# Patient Record
Sex: Female | Born: 1985 | Race: Black or African American | Hispanic: No | Marital: Single | Smoking: Current every day smoker
Health system: Southern US, Community
[De-identification: ages and names within clinical notes are randomized; demographics above are authoritative.]

---

## 2012-03-26 HISTORY — PX: TUBAL LIGATION: SHX77

## 2016-01-11 ENCOUNTER — Emergency Department (HOSPITAL_COMMUNITY)
Admission: EM | Admit: 2016-01-11 | Discharge: 2016-01-11 | Disposition: A | Discharge status: Home / Self Care | Payer: Self-pay | Attending: Emergency Medicine | Admitting: Emergency Medicine

## 2016-01-11 ENCOUNTER — Encounter (HOSPITAL_COMMUNITY): Payer: Self-pay | Admitting: Cardiology

## 2016-01-11 DIAGNOSIS — Z3202 Encounter for pregnancy test, result negative: Secondary | ICD-10-CM | POA: Insufficient documentation

## 2016-01-11 DIAGNOSIS — R1030 Lower abdominal pain, unspecified: Secondary | ICD-10-CM | POA: Insufficient documentation

## 2016-01-11 DIAGNOSIS — F17200 Nicotine dependence, unspecified, uncomplicated: Secondary | ICD-10-CM | POA: Insufficient documentation

## 2016-01-11 DIAGNOSIS — N898 Other specified noninflammatory disorders of vagina: Secondary | ICD-10-CM | POA: Insufficient documentation

## 2016-01-11 LAB — URINE MICROSCOPIC-ADD ON

## 2016-01-11 LAB — COMPREHENSIVE METABOLIC PANEL
ALT: 20 U/L (ref 14–54)
AST: 22 U/L (ref 15–41)
Albumin: 4.7 g/dL (ref 3.5–5.0)
Alkaline Phosphatase: 83 U/L (ref 38–126)
Anion gap: 13 (ref 5–15)
BILIRUBIN TOTAL: 0.8 mg/dL (ref 0.3–1.2)
BUN: 11 mg/dL (ref 6–20)
CO2: 22 mmol/L (ref 22–32)
CREATININE: 0.7 mg/dL (ref 0.44–1.00)
Calcium: 9.9 mg/dL (ref 8.9–10.3)
Chloride: 105 mmol/L (ref 101–111)
Glucose, Bld: 101 mg/dL — ABNORMAL HIGH (ref 65–99)
POTASSIUM: 4 mmol/L (ref 3.5–5.1)
Sodium: 140 mmol/L (ref 135–145)
TOTAL PROTEIN: 8 g/dL (ref 6.5–8.1)

## 2016-01-11 LAB — URINALYSIS, ROUTINE W REFLEX MICROSCOPIC
Bilirubin Urine: NEGATIVE
Glucose, UA: NEGATIVE mg/dL
KETONES UR: NEGATIVE mg/dL
NITRITE: NEGATIVE
PROTEIN: NEGATIVE mg/dL
Specific Gravity, Urine: 1.021 (ref 1.005–1.030)
pH: 5.5 (ref 5.0–8.0)

## 2016-01-11 LAB — CBC
HEMATOCRIT: 41.7 % (ref 36.0–46.0)
Hemoglobin: 13.2 g/dL (ref 12.0–15.0)
MCH: 29.1 pg (ref 26.0–34.0)
MCHC: 31.7 g/dL (ref 30.0–36.0)
MCV: 91.9 fL (ref 78.0–100.0)
PLATELETS: 299 10*3/uL (ref 150–400)
RBC: 4.54 MIL/uL (ref 3.87–5.11)
RDW: 12.2 % (ref 11.5–15.5)
WBC: 9.2 10*3/uL (ref 4.0–10.5)

## 2016-01-11 LAB — WET PREP, GENITAL
SPERM: NONE SEEN
Trich, Wet Prep: NONE SEEN
Yeast Wet Prep HPF POC: NONE SEEN

## 2016-01-11 LAB — POC URINE PREG, ED: Preg Test, Ur: NEGATIVE

## 2016-01-11 MED ORDER — METRONIDAZOLE 500 MG PO TABS: 500.0000 mg | ORAL_TABLET | Freq: Two times a day (BID) | ORAL | Status: DC

## 2016-01-11 MED ORDER — DOXYCYCLINE HYCLATE 100 MG PO CAPS: 100.0000 mg | ORAL_CAPSULE | Freq: Two times a day (BID) | ORAL | Status: DC

## 2016-01-11 MED ORDER — PREDNISONE 20 MG PO TABS: 40.0000 mg | ORAL_TABLET | Freq: Every day | ORAL | Status: DC

## 2016-01-11 MED ORDER — AZITHROMYCIN 250 MG PO TABS
1000.0000 mg | ORAL_TABLET | Freq: Once | ORAL | Status: AC
  Administered 2016-01-11: 1000 mg via ORAL
  Filled 2016-01-11: qty 4

## 2016-01-11 MED ORDER — STERILE WATER FOR INJECTION IJ SOLN
INTRAMUSCULAR | Status: AC
  Administered 2016-01-11: 0.9 mL
  Filled 2016-01-11: qty 10

## 2016-01-11 MED ORDER — CEFTRIAXONE SODIUM 250 MG IJ SOLR
250.0000 mg | Freq: Once | INTRAMUSCULAR | Status: AC
  Administered 2016-01-11: 250 mg via INTRAMUSCULAR
  Filled 2016-01-11: qty 250

## 2016-01-11 NOTE — ED Provider Notes (Signed)
CSN: [id number]     Arrival date & time 01/11/16  1444 History   First MD Initiated Contact with Patient 01/11/16 1711     Chief Complaint  Patient presents with  . Vaginal Discharge  . Abdominal Pain   HPI Bianca Montgomery is a 30 y.o. F with no significant presenting with a 2 day history of vaginal discharge and lower abdominal pain. She describes her pain as 10/10 pain scale, suprapubic, non-radiating, crampy, intermittent. She recently found out her boyfriend was unfaithful. She denies fevers, chills, CP, SOB, N/V/D. She denies vaginal itching/odor.   History reviewed. No pertinent past medical history. History reviewed. No pertinent past surgical history. History reviewed. No pertinent family history. Social History  Substance Use Topics  . Smoking status: Current Every Day Smoker  . Smokeless tobacco: None  . Alcohol Use: Yes   OB History    No data available     Review of Systems  Ten systems are reviewed and are negative for acute change except as noted in the HPI  Allergies  Review of patient's allergies indicates no known allergies.  Home Medications   Prior to Admission medications   Medication Sig Start Date End Date Taking? Authorizing Provider  doxycycline (VIBRAMYCIN) 100 MG capsule Take 1 capsule (100 mg total) by mouth 2 (two) times daily. 01/11/16   Melton Krebs, PA-C  metroNIDAZOLE (FLAGYL) 500 MG tablet Take 1 tablet (500 mg total) by mouth 2 (two) times daily. 01/11/16   Melton Krebs, PA-C   BP 116/56 mmHg  Pulse 70  Temp(Src) 98.1 F (36.7 C) (Oral)  Resp 16  Ht  (1.727 m)  Wt 56.7 kg  BMI 19.01 kg/m2  SpO2 100%  LMP 12/31/2015 Physical Exam  Constitutional: She appears well-developed and well-nourished. No distress.  HENT:  Head: Normocephalic and atraumatic.  Mouth/Throat: Oropharynx is clear and moist. No oropharyngeal exudate.  Eyes: Conjunctivae are normal. Pupils are equal, round, and reactive to light. Right eye  exhibits no discharge. Left eye exhibits no discharge. No scleral icterus.  Neck: No tracheal deviation present.  Cardiovascular: Normal rate, regular rhythm, normal heart sounds and intact distal pulses.  Exam reveals no gallop and no friction rub.   No murmur heard. Pulmonary/Chest: Effort normal and breath sounds normal. No respiratory distress. She has no wheezes. She has no rales. She exhibits no tenderness.  Abdominal: Soft. Bowel sounds are normal. She exhibits no distension and no mass. There is tenderness. There is no rebound and no guarding.  Mild suprapubic tenderness  Genitourinary:  Pelvic exam: VULVA: normal appearing vulva with no masses, tenderness or lesions, VAGINA: normal appearing vagina with normal color and discharge, no lesions, CERVIX: off-white lesion present 5 o'clock, UTERUS: uterus is normal size, shape, consistency and nontender, ADNEXA: normal adnexa in size, nontender and no masses.   Musculoskeletal: She exhibits no edema.  Lymphadenopathy:    She has no cervical adenopathy.  Neurological: She is alert. Coordination normal.  Skin: Skin is warm and dry. No rash noted. She is not diaphoretic. No erythema.  Psychiatric: She has a normal mood and affect. Her behavior is normal.  Nursing note and vitals reviewed.   ED Course  Procedures  Labs Review Labs Reviewed  WET PREP, GENITAL - Abnormal; Notable for the following:    Clue Cells Wet Prep HPF POC PRESENT (*)    WBC, Wet Prep HPF POC TOO NUMEROUS TO COUNT (*)    All other components within normal limits  COMPREHENSIVE METABOLIC PANEL - Abnormal; Notable for the following:    Glucose, Bld 101 (*)    All other components within normal limits  URINALYSIS, ROUTINE W REFLEX MICROSCOPIC (NOT AT [facility]) - Abnormal; Notable for the following:    APPearance CLOUDY (*)    Hgb urine dipstick MODERATE (*)    Leukocytes, UA LARGE (*)    All other components within normal limits  URINE MICROSCOPIC-ADD ON - Abnormal;  Notable for the following:    Squamous Epithelial / LPF 0-5 (*)    Bacteria, UA FEW (*)    All other components within normal limits  CBC  RPR  HIV ANTIBODY (ROUTINE TESTING)  POC URINE PREG, ED  GC/CHLAMYDIA PROBE AMP ([facility]) NOT AT [facility]   MDM   Final diagnoses:  Vaginal discharge   Patient non-toxic appearing and VSS. Based on patient history and physical exam, most likely etiologies are UTI vs PID/STI. Less likely etiologies include appendicitis, Meckel's diverticulum, ruptured ectopic pregnancy, ovarian torsion, ovarian cyst, kidney stone, psoas abscess.  Clue cells on wet prep. UA with hematuria.   Medications  cefTRIAXone (ROCEPHIN) injection 250 mg (250 mg Intramuscular Given 01/11/16 1918)  azithromycin (ZITHROMAX) tablet 1,000 mg (1,000 mg Oral Given 01/11/16 1918)  sterile water (preservative free) injection (0.9 mLs  Given 01/11/16 1920)   Patient feels improved after observation and/or treatment in ED.  Patient may be safely discharged home with  Discharge Medication List as of 01/11/2016  7:12 PM    START taking these medications   Details  doxycycline (VIBRAMYCIN) 100 MG capsule Take 1 capsule (100 mg total) by mouth 2 (two) times daily., Starting 01/11/2016, Until Discontinued, Print    metroNIDAZOLE (FLAGYL) 500 MG tablet Take 1 tablet (500 mg total) by mouth 2 (two) times daily., Starting 01/11/2016, Until Discontinued, Print       Discussed reasons for return. Patient to follow-up with gynecology regarding cervical lesion and urology regarding hematuria. Patient in understanding and agreement with the plan.   Melton Krebs, PA-C 01/13/16 2255  Richardean Canal, MD 01/14/16 [phone]

## 2016-01-11 NOTE — ED Notes (Signed)
Pt reports vaginal discharge for the past 2 days along with some mild abd pain. States she recently found out her boyfriend cheated on her.

## 2016-01-11 NOTE — Discharge Instructions (Signed)
Ms. Bianca Montgomery,  Nice meeting you! Please follow-up with gynecology regarding your cervical lesion and urology regarding the blood in your urine. Return to the emergency department if you develop increasing abdominal pain, fevers, chills, N/V. Feel better soon!  S. Lane Hacker, PA-C

## 2016-01-12 LAB — HIV ANTIBODY (ROUTINE TESTING W REFLEX): HIV Screen 4th Generation wRfx: NONREACTIVE

## 2016-01-12 LAB — RPR: RPR Ser Ql: NONREACTIVE

## 2016-01-14 LAB — GC/CHLAMYDIA PROBE AMP (~~LOC~~) NOT AT ARMC
Chlamydia: POSITIVE — AB
NEISSERIA GONORRHEA: POSITIVE — AB

## 2016-01-15 ENCOUNTER — Telehealth (HOSPITAL_BASED_OUTPATIENT_CLINIC_OR_DEPARTMENT_OTHER): Payer: Self-pay | Admitting: Emergency Medicine

## 2016-03-18 ENCOUNTER — Encounter (HOSPITAL_COMMUNITY): Payer: Self-pay | Admitting: Emergency Medicine

## 2016-03-18 ENCOUNTER — Ambulatory Visit (INDEPENDENT_AMBULATORY_CARE_PROVIDER_SITE_OTHER)
Admission: EM | Admit: 2016-03-18 | Discharge: 2016-03-18 | Disposition: A | Discharge status: Home / Self Care | Payer: Self-pay | Source: Home / Self Care | Attending: Family Medicine | Admitting: Family Medicine

## 2016-03-18 DIAGNOSIS — T162XXA Foreign body in left ear, initial encounter: Secondary | ICD-10-CM

## 2016-03-18 DIAGNOSIS — J302 Other seasonal allergic rhinitis: Secondary | ICD-10-CM

## 2016-03-18 MED ORDER — PREDNISONE 50 MG PO TABS: ORAL_TABLET | ORAL | Status: DC

## 2016-03-18 MED ORDER — METHYLPREDNISOLONE ACETATE 80 MG/ML IJ SUSP
INTRAMUSCULAR | Status: AC
  Filled 2016-03-18: qty 1

## 2016-03-18 MED ORDER — FLUTICASONE PROPIONATE 50 MCG/ACT NA SUSP: 1.0000 | Freq: Two times a day (BID) | NASAL | Status: DC

## 2016-03-18 MED ORDER — METHYLPREDNISOLONE ACETATE 80 MG/ML IJ SUSP
80.0000 mg | Freq: Once | INTRAMUSCULAR | Status: AC
  Administered 2016-03-18: 80 mg via INTRAMUSCULAR

## 2016-03-18 NOTE — ED Provider Notes (Signed)
CSN: [id number]     Arrival date & time 03/18/16  1331 History   First MD Initiated Contact with Patient 03/18/16 1458     No chief complaint on file.  (Consider location/radiation/quality/duration/timing/severity/associated sxs/prior Treatment) Patient is a 30 y.o. female presenting with headaches. The history is provided by the patient.  Headache Pain location:  R temporal Quality:  Dull Radiates to:  Does not radiate Onset quality:  Gradual Duration:  3 days Progression:  Unchanged Chronicity:  New Similar to prior headaches: no   Associated symptoms: congestion, drainage and ear pain     No past medical history on file. No past surgical history on file. No family history on file. Social History  Substance Use Topics  . Smoking status: Current Every Day Smoker  . Smokeless tobacco: Not on file  . Alcohol Use: Yes   OB History    No data available     Review of Systems  Constitutional: Negative.   HENT: Positive for congestion, ear pain and postnasal drip.   Respiratory: Negative.   Cardiovascular: Negative.   Neurological: Positive for headaches.  All other systems reviewed and are negative.   Allergies  Review of patient's allergies indicates no known allergies.  Home Medications   Prior to Admission medications   Medication Sig Start Date End Date Taking? Authorizing Provider  doxycycline (VIBRAMYCIN) 100 MG capsule Take 1 capsule (100 mg total) by mouth 2 (two) times daily. 01/11/16   Melton KrebsSamantha Nicole Riley, PA-C  metroNIDAZOLE (FLAGYL) 500 MG tablet Take 1 tablet (500 mg total) by mouth 2 (two) times daily. 01/11/16   Melton KrebsSamantha Nicole Riley, PA-C  predniSONE (DELTASONE) 20 MG tablet Take 2 tablets (40 mg total) by mouth daily. 01/11/16   Melton KrebsSamantha Nicole Riley, PA-C   Meds Ordered and Administered this Visit  Medications - No data to display  BP 119/77 mmHg  Pulse 101  Temp(Src) 98.6 F (37 C) (Oral)  Resp 14  SpO2 100% No data found.   Physical Exam    Constitutional: She is oriented to person, place, and time. She appears well-developed and well-nourished. No distress.  HENT:  Head: Normocephalic.  Right Ear: Hearing, tympanic membrane, external ear and ear canal normal.  Left Ear: A foreign body is present.  Ears:  Mouth/Throat: Oropharynx is clear and moist.  Eyes: Conjunctivae and EOM are normal. Pupils are equal, round, and reactive to light.  Neck: Normal range of motion. Neck supple.  Lymphadenopathy:    She has no cervical adenopathy.  Neurological: She is alert and oriented to person, place, and time.  Skin: Skin is warm and dry.  Nursing note and vitals reviewed.   ED Course  Procedures (including critical care time)  Labs Review Labs Reviewed - No data to display  Imaging Review No results found.   Visual Acuity Review  Right Eye Distance:   Left Eye Distance:   Bilateral Distance:    Right Eye Near:   Left Eye Near:    Bilateral Near:         MDM  No diagnosis found.  Meds ordered this encounter  Medications  . fluticasone (FLONASE) 50 MCG/ACT nasal spray    Sig: Place 1 spray into both nostrils 2 (two) times daily.    Dispense:  1 g    Refill:  2  . predniSONE (DELTASONE) 50 MG tablet    Sig: 1 tab daily for 2 days then 1/2 tab daily for 2 days.    Dispense:  3  tablet    Refill:  0  . methylPREDNISolone acetate (DEPO-MEDROL) injection 80 mg    Sig:       Linna Hoff, MD 03/18/16 [phone]

## 2016-03-18 NOTE — ED Notes (Signed)
Headache for 3 days

## 2016-07-28 ENCOUNTER — Ambulatory Visit (HOSPITAL_COMMUNITY)
Admission: EM | Admit: 2016-07-28 | Discharge: 2016-07-28 | Disposition: A | Discharge status: Home / Self Care | Payer: Self-pay | Attending: Family Medicine | Admitting: Family Medicine

## 2016-07-28 ENCOUNTER — Encounter (HOSPITAL_COMMUNITY): Payer: Self-pay | Admitting: Emergency Medicine

## 2016-07-28 DIAGNOSIS — N898 Other specified noninflammatory disorders of vagina: Secondary | ICD-10-CM | POA: Insufficient documentation

## 2016-07-28 DIAGNOSIS — F17200 Nicotine dependence, unspecified, uncomplicated: Secondary | ICD-10-CM | POA: Insufficient documentation

## 2016-07-28 DIAGNOSIS — N899 Noninflammatory disorder of vagina, unspecified: Secondary | ICD-10-CM | POA: Insufficient documentation

## 2016-07-28 DIAGNOSIS — N907 Vulvar cyst: Secondary | ICD-10-CM

## 2016-07-28 NOTE — Discharge Instructions (Signed)
Human Papillomavirus Human papillomavirus (HPV) is the most common sexually transmitted infection (STI). It is easy to pass it from person to person (contagious). HPV can cause cervical cancer, anal cancer, and genital warts. The genital warts can be seen and felt. Also, there may be wartlike regions in the throat. HPV may not have any symptoms. It is possible to have HPV for a long time and not know it. You may pass HPV on to others without knowing it.  HOME CARE   Take medicines as told by your doctor.  Use over-the-counter creams for itching as told by your doctor.  Keep all follow-up visits. Make sure to get Pap tests as told by your doctor.  Do not touch or scratch the warts.  Do not treat genital warts with medicines used for treating hand warts.  Do not have sex while you are getting treatment.  Do not douche or use tampons during treatment of HPV.  Tell your sex partner about your infection because he or she may also need treatment.  If you get pregnant, tell your doctor that you had HPV. Your doctor will watch your pregnancy closely. This is important to keep your baby safe.  After treatment, use condoms during sex to prevent future infections.  Have only one sex partner.  Have a sex partner who does not have other sex partners. GET HELP IF:   The treated skin is red, swollen, or painful.  You have a fever.  You feel ill.  You feel lumps or pimple-like areas in and around your genital area.  You have bleeding of the vagina or the area that was treated.  You have pain during sex. MAKE SURE YOU:  Understand these instructions.  Will watch your condition.  Will get help if you are not doing well or get worse.   This information is not intended to replace advice given to you by your health care provider. Make sure you discuss any questions you have with your health care provider.   Document Released: 11/13/2008 Document Revised: 12/22/2014 Document Reviewed:  03/08/2014 Elsevier Interactive Patient Education 2016 Elsevier Inc.  

## 2016-07-28 NOTE — ED Provider Notes (Signed)
CSN: [id number]     Arrival date & time 07/28/16  1306 History   None    No chief complaint on file.  (Consider location/radiation/quality/duration/timing/severity/associated sxs/prior Treatment) Patient states she has vaginal mass or bump that was picked up on when she did her pap smear at the hospital.  Patient also states she does have some vaginal DC.  She does not use protection when having sex.   The history is provided by the patient.  Ear Drainage  This is a new problem. The current episode started 2 days ago. The problem occurs constantly. The problem has not changed since onset.Nothing aggravates the symptoms. Nothing relieves the symptoms. She has tried nothing for the symptoms.    No past medical history on file. No past surgical history on file. No family history on file. Social History  Substance Use Topics  . Smoking status: Current Every Day Smoker  . Smokeless tobacco: Not on file  . Alcohol use Yes   OB History    No data available     Review of Systems  Constitutional: Negative.   HENT: Negative.   Eyes: Negative.   Respiratory: Negative.   Cardiovascular: Negative.   Gastrointestinal: Negative.   Endocrine: Negative.   Genitourinary: Positive for vaginal discharge.  Musculoskeletal: Negative.   Skin: Negative.   Allergic/Immunologic: Negative.   Neurological: Negative.   Psychiatric/Behavioral: Negative.     Allergies  Review of patient's allergies indicates no known allergies.  Home Medications   Prior to Admission medications   Medication Sig Start Date End Date Taking? Authorizing Provider  doxycycline (VIBRAMYCIN) 100 MG capsule Take 1 capsule (100 mg total) by mouth 2 (two) times daily. Patient not taking: Reported on 03/18/2016 01/11/16   Melton KrebsSamantha Nicole Riley, PA-C  fluticasone [facility](FLONASE) 50 MCG/ACT nasal spray Place 1 spray into both nostrils 2 (two) times daily. 03/18/16   Linna HoffJames D Kindl, MD  metroNIDAZOLE (FLAGYL) 500 MG tablet Take 1 tablet  (500 mg total) by mouth 2 (two) times daily. Patient not taking: Reported on 03/18/2016 01/11/16   Melton KrebsSamantha Nicole Riley, PA-C  predniSONE (DELTASONE) 50 MG tablet 1 tab daily for 2 days then 1/2 tab daily for 2 days. 03/18/16   Linna HoffJames D Kindl, MD   Meds Ordered and Administered this Visit  Medications - No data to display  BP 117/70 (BP Location: Left Arm)   Pulse 78   Temp 98.9 F (37.2 C) (Oral)   Resp 12   SpO2 100%  No data found.   Physical Exam  Constitutional: She is oriented to person, place, and time. She appears well-developed and well-nourished.  HENT:  Head: Normocephalic and atraumatic.  Eyes: EOM are normal. Pupils are equal, round, and reactive to light.  Genitourinary:  Genitourinary Comments: Left inner Labia with cyst.   Cervix wnl without CMT  Musculoskeletal: Normal range of motion.  Neurological: She is alert and oriented to person, place, and time. She has normal reflexes.  Nursing note and vitals reviewed.   Urgent Care Course   Clinical Course    Procedures (including critical care time)  Labs Review Labs Reviewed - No data to display  Imaging Review No results found.   Visual Acuity Review  Right Eye Distance:   Left Eye Distance:   Bilateral Distance:    Right Eye Near:   Left Eye Near:    Bilateral Near:         MDM  Vaginal DC - Endocervical Cx for GC Chlamydia and wet  prep Vaginal Cyst/Mass - [facility].    Anselm PancoastWilliam J AureliaOxford, [place]FNP 07/28/16 1353

## 2016-07-29 ENCOUNTER — Telehealth: Payer: Self-pay | Admitting: Internal Medicine

## 2016-07-29 LAB — CERVICOVAGINAL ANCILLARY ONLY
Chlamydia: POSITIVE — AB
Neisseria Gonorrhea: NEGATIVE
Wet Prep (BD Affirm): POSITIVE — AB

## 2016-07-29 NOTE — Telephone Encounter (Signed)
Clinical staff, please let patient and health department know that test for chlamydia was positive.  Needs rx for zithromax 1g po x 1 dose, no refills.  Sexual partners need to be notified and tested/treated.   Test for gardnerella (bacterial vaginosis) was positive as well; this only needs to be treated if patient has symptoms such as vaginal discharge/discomfort.  If having symptoms, would send rx for metronidazole 500mg  bid x 7d #14 no refills. [facility].  LM

## 2016-07-30 ENCOUNTER — Telehealth (HOSPITAL_COMMUNITY): Payer: Self-pay | Admitting: Emergency Medicine

## 2016-07-30 NOTE — Telephone Encounter (Signed)
Per Dr. Dayton ScrapeMurray,  Notes Recorded by Eustace MooreLaura W Murray, MD on 07/29/2016 at 9:49 PM EDT Clinical staff, please let patient and health department know that test for chlamydia was positive.  Needs rx for zithromax 1g po x 1 dose, no refills.  Sexual partners need to be notified and tested/treated.  Test for gardnerella (bacterial vaginosis) was positive as well; this only needs to be treated if patient has symptoms such as vaginal discharge/discomfort.  If having symptoms, would send rx for metronidazole 500mg  bid x 7d #14 no refills. [facility] of [facility]  Note sent to patient's MyChart. LM  Called [phone] but no answer.  Need to give lab results from recent visit on 8/14 Also let pt know labs can be obtained from MyChart Will try later and will [facility] notified.

## 2016-08-13 NOTE — Telephone Encounter (Signed)
Called [phone], no answer and no VM Need to see how pt is doing and to give lab results from recent visit on 8/14 Mailed letter for pt to contact us.  Also let pt know labs can be obtained from MyChart [facility] that pt we have not been able to contact pt.

## 2017-09-01 ENCOUNTER — Encounter (HOSPITAL_COMMUNITY): Payer: Self-pay | Admitting: *Deleted

## 2017-09-01 ENCOUNTER — Emergency Department (HOSPITAL_COMMUNITY)
Admission: EM | Admit: 2017-09-01 | Discharge: 2017-09-01 | Disposition: A | Discharge status: Home / Self Care | Payer: Self-pay | Attending: Emergency Medicine | Admitting: Emergency Medicine

## 2017-09-01 DIAGNOSIS — F17200 Nicotine dependence, unspecified, uncomplicated: Secondary | ICD-10-CM | POA: Insufficient documentation

## 2017-09-01 DIAGNOSIS — Z91013 Allergy to seafood: Secondary | ICD-10-CM | POA: Insufficient documentation

## 2017-09-01 DIAGNOSIS — M545 Low back pain: Secondary | ICD-10-CM | POA: Insufficient documentation

## 2017-09-01 DIAGNOSIS — G8929 Other chronic pain: Secondary | ICD-10-CM

## 2017-09-01 LAB — URINALYSIS, ROUTINE W REFLEX MICROSCOPIC
Bilirubin Urine: NEGATIVE
GLUCOSE, UA: NEGATIVE mg/dL
HGB URINE DIPSTICK: NEGATIVE
KETONES UR: NEGATIVE mg/dL
NITRITE: NEGATIVE
PH: 7 (ref 5.0–8.0)
Protein, ur: 30 mg/dL — AB
SPECIFIC GRAVITY, URINE: 1.014 (ref 1.005–1.030)

## 2017-09-01 LAB — PREGNANCY, URINE: PREG TEST UR: NEGATIVE

## 2017-09-01 MED ORDER — METHYLPREDNISOLONE 4 MG PO TBPK: ORAL_TABLET | ORAL | 0 refills | Status: DC

## 2017-09-01 NOTE — Discharge Instructions (Signed)
Please read attached information regarding your condition. Take prednisone in tapered dose as directed. Apply heat to affected area. Return to ED for severe back pain, injury, falls, loss of bladder function, numbness.

## 2017-09-01 NOTE — ED Provider Notes (Signed)
[facility]-EMERGENCY DEPT Provider Note   CSN: [id number] Arrival date & time: 09/01/17  1314     History   Chief Complaint Chief Complaint  Patient presents with  . Back Pain    HPI Bianca Montgomery is a 31 y.o. female.  HPI Patient presents to ED for 3 week history of lower back pain. Describes the pain as achy and rates it as 8/10.She works at a [employer] and states that she is constantly on her feet, moving heavy objects, lifting heavy objects. She also has young children at home that she has to care for. She has tried Tylenol, Aleve, warm compresses with no relief in symptoms. She denies any numbness in legs, urinary incontinence, dysuria, prior back surgery, history of cancer, history of IV drug use, falls, injuries.  History reviewed. No pertinent past medical history.  There are no active problems to display for this patient.   History reviewed. No pertinent surgical history.  OB History    No data available       Home Medications    Prior to Admission medications   Medication Sig Start Date End Date Taking? Authorizing Provider  doxycycline (VIBRAMYCIN) 100 MG capsule Take 1 capsule (100 mg total) by mouth 2 (two) times daily. Patient not taking: Reported on 03/18/2016 01/11/16   Melton Krebs, PA-C  fluticasone [facility]) 50 MCG/ACT nasal spray Place 1 spray into both nostrils 2 (two) times daily. 03/18/16   Linna Hoff, MD  methylPREDNISolone (MEDROL DOSEPAK) 4 MG TBPK tablet Taper over 6 days. 09/01/17   Tommie Bohlken, PA-C  metroNIDAZOLE (FLAGYL) 500 MG tablet Take 1 tablet (500 mg total) by mouth 2 (two) times daily. Patient not taking: Reported on 03/18/2016 01/11/16   Melton Krebs, PA-C  predniSONE (DELTASONE) 50 MG tablet 1 tab daily for 2 days then 1/2 tab daily for 2 days. 03/18/16   Linna Hoff, MD    Family History No family history on file.  Social History Social History  Substance Use Topics  . Smoking status: Current Every  Day Smoker  . Smokeless tobacco: Never Used  . Alcohol use Not on file     Allergies   Shellfish allergy   Review of Systems Review of Systems  Constitutional: Negative for chills and fever.  Gastrointestinal: Negative for nausea and vomiting.  Genitourinary: Negative for dysuria, flank pain, hematuria, pelvic pain and vaginal discharge.  Musculoskeletal: Positive for back pain. Negative for arthralgias, gait problem, joint swelling and myalgias.  Skin: Negative for rash and wound.  Neurological: Negative for weakness and numbness.     Physical Exam Updated Vital Signs BP (!) 128/96 (BP Location: Left Arm)   Pulse 72   Temp 98.2 F (36.8 C) (Oral)   Resp 18   SpO2 100%   Physical Exam  Constitutional: She appears well-developed and well-nourished. No distress.  HENT:  Head: Normocephalic and atraumatic.  Eyes: Conjunctivae and EOM are normal. No scleral icterus.  Neck: Normal range of motion.  Pulmonary/Chest: Effort normal. No respiratory distress.  Musculoskeletal: Normal range of motion. She exhibits tenderness. She exhibits no edema or deformity.       Arms: No midline spinal tenderness present in lumbar, thoracic or cervical spine. No step-off palpated. No visible bruising, edema or temperature change noted. No objective signs of numbness present. No saddle anesthesia. 2+ DP pulses bilaterally. Sensation intact to light touch. Strength 5/5 in bilateral lower extremities.  Neurological: She is alert.  Skin: No rash noted.  She is not diaphoretic.  Psychiatric: She has a normal mood and affect.  Nursing note and vitals reviewed.    ED Treatments / Results  Labs (all labs ordered are listed, but only abnormal results are displayed) Labs Reviewed  URINALYSIS, ROUTINE W REFLEX MICROSCOPIC - Abnormal; Notable for the following:       Result Value   APPearance HAZY (*)    Protein, ur 30 (*)    Leukocytes, UA TRACE (*)    Bacteria, UA FEW (*)    Squamous  Epithelial / LPF 6-30 (*)    All other components within normal limits  PREGNANCY, URINE    EKG  EKG Interpretation None       Radiology No results found.  Procedures Procedures (including critical care time)  Medications Ordered in ED Medications - No data to display   Initial Impression / Assessment and Plan / ED Course  I have reviewed the triage vital signs and the nursing notes.  Pertinent labs & imaging results that were available during my care of the patient were reviewed by me and considered in my medical decision making (see chart for details).     Patient presents to ED for evaluation of pain in lower back and all over back for the past 3 weeks. She does report overuse at work and having heavy objects and lifting heavy objects. She denies any urinary incontinence, prior back surgery, numbness, falls, injuries, history of cancer history of IV drug use. On physical exam she is nontoxic-appearing and in no acute distress. She is ambulatory here in the ED with normal gait. She has tenderness to palpation in the lumbar paraspinal musculature bilaterally.  There is strength 5/5 in bilateral lower extremities and sensation is intact to light touch. Urinalysis and urine pregnancy both unremarkable. I have low suspicion for cauda equina or other acute spinal cord injury being the cause of her back pain. We will if patient instructions on heat therapy, exercises and give steroids in tapered dose to help with inflammation of what appears to be overuse. Patient appears stable for discharge at this time. Strict return precautions given.  Final Clinical Impressions(s) / ED Diagnoses   Final diagnoses:  Chronic bilateral low back pain without sciatica    New Prescriptions New Prescriptions   METHYLPREDNISOLONE (MEDROL DOSEPAK) 4 MG TBPK TABLET    Taper over 6 days.     Dietrich Pates, PA-C 09/01/17 1621    Melene Plan, DO 09/04/17 [phone]

## 2017-09-01 NOTE — ED Triage Notes (Signed)
PT states her whole back is hurting.  She just remembers waking up and her whole back was just hurting. Nothing works. No incontinence.  No injury.

## 2017-10-05 ENCOUNTER — Emergency Department (HOSPITAL_COMMUNITY)
Admission: EM | Admit: 2017-10-05 | Discharge: 2017-10-06 | Disposition: A | Discharge status: Home / Self Care | Payer: Self-pay | Attending: Emergency Medicine | Admitting: Emergency Medicine

## 2017-10-05 ENCOUNTER — Encounter (HOSPITAL_COMMUNITY): Payer: Self-pay | Admitting: Emergency Medicine

## 2017-10-05 DIAGNOSIS — R0989 Other specified symptoms and signs involving the circulatory and respiratory systems: Secondary | ICD-10-CM | POA: Insufficient documentation

## 2017-10-05 DIAGNOSIS — M791 Myalgia, unspecified site: Secondary | ICD-10-CM | POA: Insufficient documentation

## 2017-10-05 DIAGNOSIS — R0981 Nasal congestion: Secondary | ICD-10-CM | POA: Insufficient documentation

## 2017-10-05 DIAGNOSIS — Z79899 Other long term (current) drug therapy: Secondary | ICD-10-CM | POA: Insufficient documentation

## 2017-10-05 DIAGNOSIS — J029 Acute pharyngitis, unspecified: Secondary | ICD-10-CM | POA: Insufficient documentation

## 2017-10-05 DIAGNOSIS — R05 Cough: Secondary | ICD-10-CM | POA: Insufficient documentation

## 2017-10-05 DIAGNOSIS — B349 Viral infection, unspecified: Secondary | ICD-10-CM | POA: Insufficient documentation

## 2017-10-05 MED ORDER — ACETAMINOPHEN 325 MG PO TABS
650.0000 mg | ORAL_TABLET | Freq: Once | ORAL | Status: AC
  Administered 2017-10-06: 650 mg via ORAL
  Filled 2017-10-05: qty 2

## 2017-10-05 NOTE — ED Provider Notes (Signed)
[facility] EMERGENCY DEPARTMENT Provider Note   CSN: [id number] Arrival date & time: 10/05/17  2131     History   Chief Complaint Chief Complaint  Patient presents with  . Generalized Body Aches    HPI Bianca Montgomery is a 31 y.o. female that significant past medical history, presenting to the ED with acute onset of persistent worsening generalized myalgias and intermittent fever that began yesterday morning. Tmax 100.12F today. Patient also reports mild sore throat, dry cough, and nasal congestion with some runny nose. No medications tried for symptoms. She has not received a flu shot this year. Denies difficulty breathing or swallowing, neck pain or stiffness, headache, abdominal pain, nausea, or any other complaints.  The history is provided by the patient.    History reviewed. No pertinent past medical history.  There are no active problems to display for this patient.   History reviewed. No pertinent surgical history.  OB History    No data available       Home Medications    Prior to Admission medications   Medication Sig Start Date End Date Taking? Authorizing Provider  doxycycline (VIBRAMYCIN) 100 MG capsule Take 1 capsule (100 mg total) by mouth 2 (two) times daily. Patient not taking: Reported on 03/18/2016 01/11/16   Melton Krebs, PA-C  fluticasone [facility]) 50 MCG/ACT nasal spray Place 1 spray into both nostrils 2 (two) times daily. 03/18/16   Linna Hoff, MD  methylPREDNISolone (MEDROL DOSEPAK) 4 MG TBPK tablet Taper over 6 days. 09/01/17   Khatri, Hina, PA-C  metroNIDAZOLE (FLAGYL) 500 MG tablet Take 1 tablet (500 mg total) by mouth 2 (two) times daily. Patient not taking: Reported on 03/18/2016 01/11/16   Melton Krebs, PA-C  predniSONE (DELTASONE) 50 MG tablet 1 tab daily for 2 days then 1/2 tab daily for 2 days. 03/18/16   Linna Hoff, MD    Family History No family history on file.  Social History Social History    Substance Use Topics  . Smoking status: Current Every Day Smoker  . Smokeless tobacco: Never Used  . Alcohol use No     Allergies   Shellfish allergy   Review of Systems Review of Systems  Constitutional: Positive for chills and fever.  HENT: Positive for congestion, rhinorrhea and sore throat. Negative for ear pain, trouble swallowing and voice change.   Respiratory: Positive for cough. Negative for shortness of breath.   Cardiovascular: Negative for chest pain.  Gastrointestinal: Negative for abdominal pain and nausea.  Musculoskeletal: Negative for neck pain and neck stiffness.  Neurological: Negative for headaches.     Physical Exam Updated Vital Signs BP 122/60 (BP Location: Right Arm)   Pulse 98   Temp 98.9 F (37.2 C) (Oral)   Resp 18   Ht 5\' 8"  (1.727 m)   LMP 09/05/2017   SpO2 100%   Physical Exam  Constitutional: She appears well-developed and well-nourished. No distress.  HENT:  Head: Normocephalic and atraumatic.  Right Ear: Hearing, tympanic membrane, external ear and ear canal normal.  Left Ear: Hearing, tympanic membrane, external ear and ear canal normal.  Nose: Nose normal.  Mouth/Throat: Uvula is midline. No trismus in the jaw. No uvula swelling.  Pharynx mildly erythematous, no edema or exudates. Uvula midline, no trismus. Tolerating secretions.   Eyes: Pupils are equal, round, and reactive to light. Conjunctivae and EOM are normal.  Neck: Normal range of motion. Neck supple. No tracheal deviation present.  No nuchal rigidity  Cardiovascular: Normal rate, regular rhythm, normal heart sounds and intact distal pulses.   Pulmonary/Chest: Effort normal and breath sounds normal. No stridor. No respiratory distress. She has no wheezes. She has no rales.  Abdominal: Soft. Bowel sounds are normal. She exhibits no distension. There is no tenderness. There is no rebound.  Lymphadenopathy:    She has no cervical adenopathy.  Psychiatric: She has a normal  mood and affect. Her behavior is normal.  Nursing note and vitals reviewed.    ED Treatments / Results  Labs (all labs ordered are listed, but only abnormal results are displayed) Labs Reviewed  INFLUENZA PANEL BY PCR (TYPE A & B)    EKG  EKG Interpretation None       Radiology No results found.  Procedures Procedures (including critical care time)  Medications Ordered in ED Medications  acetaminophen (TYLENOL) tablet 650 mg (650 mg Oral Given 10/06/17 0006)     Initial Impression / Assessment and Plan / ED Course  I have reviewed the triage vital signs and the nursing notes.  Pertinent labs & imaging results that were available during my care of the patient were reviewed by me and considered in my medical decision making (see chart for details).     Patients symptoms are consistent with URI, likely viral etiology. Discussed that antibiotics are not indicated for viral infections. Flu swab done, however lab backed up and swab not resulted before patient ready to leave. Discussed low likelihood of results being positive, and side effects of tamiflu. Discussed symptomatic management of viral illnesses and pt agreed to plan of discharge. Pt will be discharged with symptomatic treatment. She is afebrile, tolerating secretions, lungs CTAB. Pt is hemodynamically stable & in NAD prior to dc.  Discussed results, findings, treatment and follow up. Patient advised of return precautions. Patient verbalized understanding and agreed with plan.  Final Clinical Impressions(s) / ED Diagnoses   Final diagnoses:  Viral illness    New Prescriptions New Prescriptions   No medications on file     Russo, [place] N, PA-C 10/06/17 0103    Rolland PorterJames, Mark, MD 10/23/17 2322

## 2017-10-05 NOTE — ED Triage Notes (Signed)
Pt c/o generalized body aches and a fever that started today. Afebrile in triage. Also c/o runny nose.

## 2017-10-06 LAB — INFLUENZA PANEL BY PCR (TYPE A & B)
INFLAPCR: NEGATIVE
INFLBPCR: NEGATIVE

## 2017-10-06 NOTE — Discharge Instructions (Signed)
Please read instructions below. You can take tylenol as needed for sore throat or body aches. Drink plenty of water. Follow up with your primary care provider as needed. Return to the ER for difficulty swallowing liquids, difficulty breathing, or new or worsening symptoms. ° °

## 2018-02-22 ENCOUNTER — Encounter (HOSPITAL_COMMUNITY): Payer: Self-pay

## 2018-02-22 ENCOUNTER — Other Ambulatory Visit: Payer: Self-pay

## 2018-02-22 ENCOUNTER — Emergency Department (HOSPITAL_COMMUNITY)
Admission: EM | Admit: 2018-02-22 | Discharge: 2018-02-22 | Discharge status: Against Medical Advice | Payer: Self-pay | Attending: Emergency Medicine | Admitting: Emergency Medicine

## 2018-02-22 ENCOUNTER — Emergency Department (HOSPITAL_COMMUNITY): Payer: Self-pay

## 2018-02-22 DIAGNOSIS — R519 Headache, unspecified: Secondary | ICD-10-CM

## 2018-02-22 DIAGNOSIS — R51 Headache: Secondary | ICD-10-CM | POA: Insufficient documentation

## 2018-02-22 LAB — CBC WITH DIFFERENTIAL/PLATELET
BASOS ABS: 0.1 10*3/uL (ref 0.0–0.1)
Basophils Relative: 1 %
EOS PCT: 5 %
Eosinophils Absolute: 0.4 10*3/uL (ref 0.0–0.7)
HCT: 39.9 % (ref 36.0–46.0)
Hemoglobin: 12.6 g/dL (ref 12.0–15.0)
LYMPHS PCT: 27 %
Lymphs Abs: 2.2 10*3/uL (ref 0.7–4.0)
MCH: 29 pg (ref 26.0–34.0)
MCHC: 31.6 g/dL (ref 30.0–36.0)
MCV: 91.9 fL (ref 78.0–100.0)
Monocytes Absolute: 0.4 10*3/uL (ref 0.1–1.0)
Monocytes Relative: 5 %
Neutro Abs: 5.2 10*3/uL (ref 1.7–7.7)
Neutrophils Relative %: 62 %
Platelets: 267 10*3/uL (ref 150–400)
RBC: 4.34 MIL/uL (ref 3.87–5.11)
RDW: 12 % (ref 11.5–15.5)
WBC: 8.2 10*3/uL (ref 4.0–10.5)

## 2018-02-22 LAB — I-STAT CHEM 8, ED
BUN: 11 mg/dL (ref 6–20)
CREATININE: 0.6 mg/dL (ref 0.44–1.00)
Calcium, Ion: 1.18 mmol/L (ref 1.15–1.40)
Chloride: 102 mmol/L (ref 101–111)
GLUCOSE: 90 mg/dL (ref 65–99)
HCT: 42 % (ref 36.0–46.0)
HEMOGLOBIN: 14.3 g/dL (ref 12.0–15.0)
Potassium: 3.3 mmol/L — ABNORMAL LOW (ref 3.5–5.1)
Sodium: 142 mmol/L (ref 135–145)
TCO2: 26 mmol/L (ref 22–32)

## 2018-02-22 LAB — I-STAT BETA HCG BLOOD, ED (MC, WL, AP ONLY): I-stat hCG, quantitative: <5 m[IU]/mL

## 2018-02-22 MED ORDER — PROCHLORPERAZINE EDISYLATE 5 MG/ML IJ SOLN
10.0000 mg | Freq: Once | INTRAMUSCULAR | Status: AC
  Administered 2018-02-22: 10 mg via INTRAMUSCULAR
  Filled 2018-02-22: qty 2

## 2018-02-22 MED ORDER — DIPHENHYDRAMINE HCL 50 MG/ML IJ SOLN
25.0000 mg | Freq: Once | INTRAMUSCULAR | Status: AC
  Administered 2018-02-22: 25 mg via INTRAMUSCULAR
  Filled 2018-02-22: qty 1

## 2018-02-22 MED ORDER — KETOROLAC TROMETHAMINE 60 MG/2ML IM SOLN
60.0000 mg | Freq: Once | INTRAMUSCULAR | Status: AC
  Administered 2018-02-22: 60 mg via INTRAMUSCULAR
  Filled 2018-02-22: qty 2

## 2018-02-22 NOTE — ED Provider Notes (Signed)
Patient placed in Quick Look pathway, seen and evaluated   Chief Complaint: headache    HPI:  Pt reports headache that began today. States has chronic headaches, but states this is different. Reports pain onright side, reports blurry vision in right eye, reports dizziness. Took  Excedrin and 4 Aleves with no improvement. Reports associate nausea and vomiting.   ROS: Positive for headache, dizziness, blurred vision, nausea, vomiting.  Negative for fever, chills, neck pain or stiffness, cough, congestion.  Physical Exam:   Gen: No distress  Neuro: Awake and Alert  Skin: Warm    Focused Exam: No acute distress, pupils equal, round, reactive to light and accommodation.  Cranial nerves intact.  Moving all extremities.  5/5 and equal strength bilaterally.  Not actively vomiting.  Abdomen is nontender.  Patient in emergency department with atypical for her headache, she admits to having chronic headaches but states this 1 is different.  She reports blurred vision, mainly in the right eye.  She reports nausea and vomiting, unable to keep anything down (patient is drinking water.)  She reports dizziness and feels like she is going to pass out.  She states she has never had blurred vision with headaches in the past.  Patient states she has been willing her symptoms and is worried she may be anemic, or have diabetes, or tumor.  She is requesting imaging and blood tests.  I will order CT head given that she does have a neurological symptoms although I believe her headache is most likely a complex migraine.  Will check basic labs to screen for anemia and hyperglycemia.  Will check pregnancy test.  Migraine cocktail ordered.  Vitals:   02/22/18 1538  BP: 131/85  Pulse: 78  Resp: 16  Temp: 99.5 F (37.5 C)  TempSrc: Oral  SpO2: 96%      Initiation of care has begun. The patient has been counseled on the process, plan, and necessity for staying for the completion/evaluation, and the remainder of the  medical screening examination    Jaynie CrumbleKirichenko, Edra Riccardi, Cordelia Poche-C 02/22/18 1558    Azalia Bilisampos, Kevin, MD 02/22/18 1733

## 2018-02-22 NOTE — ED Triage Notes (Signed)
Pt states she has headache and has had some blurred vision and dizziness. Pt alert and oriented in triage. Pt states taking medications at home without relief.

## 2018-02-26 ENCOUNTER — Encounter (HOSPITAL_COMMUNITY): Payer: Self-pay | Admitting: Emergency Medicine

## 2018-02-26 ENCOUNTER — Ambulatory Visit (HOSPITAL_COMMUNITY)
Admission: EM | Admit: 2018-02-26 | Discharge: 2018-02-26 | Disposition: A | Discharge status: Home / Self Care | Payer: Self-pay | Attending: Family Medicine | Admitting: Family Medicine

## 2018-02-26 ENCOUNTER — Other Ambulatory Visit: Payer: Self-pay

## 2018-02-26 DIAGNOSIS — Z202 Contact with and (suspected) exposure to infections with a predominantly sexual mode of transmission: Secondary | ICD-10-CM

## 2018-02-26 DIAGNOSIS — Z91013 Allergy to seafood: Secondary | ICD-10-CM | POA: Insufficient documentation

## 2018-02-26 DIAGNOSIS — F17210 Nicotine dependence, cigarettes, uncomplicated: Secondary | ICD-10-CM | POA: Insufficient documentation

## 2018-02-26 DIAGNOSIS — K029 Dental caries, unspecified: Secondary | ICD-10-CM

## 2018-02-26 DIAGNOSIS — Z113 Encounter for screening for infections with a predominantly sexual mode of transmission: Secondary | ICD-10-CM

## 2018-02-26 MED ORDER — CEFTRIAXONE SODIUM 250 MG IJ SOLR
250.0000 mg | Freq: Once | INTRAMUSCULAR | Status: AC
  Administered 2018-02-26: 250 mg via INTRAMUSCULAR

## 2018-02-26 MED ORDER — STERILE WATER FOR INJECTION IJ SOLN
INTRAMUSCULAR | Status: AC
  Filled 2018-02-26: qty 10

## 2018-02-26 MED ORDER — AZITHROMYCIN 250 MG PO TABS
1000.0000 mg | ORAL_TABLET | Freq: Once | ORAL | Status: AC
  Administered 2018-02-26: 1000 mg via ORAL

## 2018-02-26 MED ORDER — CEFTRIAXONE SODIUM 250 MG IJ SOLR
INTRAMUSCULAR | Status: AC
  Filled 2018-02-26: qty 250

## 2018-02-26 MED ORDER — AZITHROMYCIN 250 MG PO TABS
ORAL_TABLET | ORAL | Status: AC
  Filled 2018-02-26: qty 4

## 2018-02-26 NOTE — ED Provider Notes (Signed)
[facility]   [id number] 02/26/18 Arrival Time: 1153   SUBJECTIVE:  Bianca Montgomery is a 32 y.o. female who presents to the urgent care with complaint of right upper lip and jaw swelling that started three days ago.  She states the gum under her lip is very sore.   Pt also reports vaginal discharge x1 week she thought was possible yeast, but she states she got a call from her exboyfriend and he states he has been having penile pain so she wants to be tested for STD's. History reviewed. No pertinent past medical history. History reviewed. No pertinent family history. Social History   Socioeconomic History  . Marital status: Single    Spouse name: Not on file  . Number of children: Not on file  . Years of education: Not on file  . Highest education level: Not on file  Social Needs  . Financial resource strain: Not on file  . Food insecurity - worry: Not on file  . Food insecurity - inability: Not on file  . Transportation needs - medical: Not on file  . Transportation needs - non-medical: Not on file  Occupational History  . Not on file  Tobacco Use  . Smoking status: Current Every Day Smoker    Packs/day: 0.10    Types: Cigarettes  . Smokeless tobacco: Never Used  Substance and Sexual Activity  . Alcohol use: No  . Drug use: No  . Sexual activity: Not on file  Other Topics Concern  . Not on file  Social History Narrative  . Not on file   No outpatient medications have been marked as taking for the 02/26/18 encounter [facility](Hospital Encounter).   Allergies  Allergen Reactions  . Shellfish Allergy       ROS: As per HPI, remainder of ROS negative.   OBJECTIVE:   Vitals:   02/26/18 1318  BP: 121/63  Pulse: 77  Temp: 98.9 F (37.2 C)  TempSrc: Oral  SpO2: 100%     General appearance: alert; no distress Eyes: PERRL; EOMI; conjunctiva normal HENT: normocephalic; atraumatic; TMs normal, canal normal, external ears normal without trauma; diffuse  dental decay with gingivitis. She has some induration beneath the nasal frenulum. Neck: supple Lungs: clear to auscultation bilaterally Heart: regular rate and rhythm Abdomen: soft, non-tender; bowel sounds normal; no masses or organomegaly; no guarding or rebound tenderness Back: no CVA tenderness Extremities: no cyanosis or edema; symmetrical with no gross deformities Skin: warm and dry Neurologic: normal gait; grossly normal Psychological: alert and cooperative; normal mood and affect      Labs:  Results for orders placed or performed during the hospital encounter of 02/22/18  CBC with Differential  Result Value Ref Range   WBC 8.2 4.0 - 10.5 K/uL   RBC 4.34 3.87 - 5.11 MIL/uL   Hemoglobin 12.6 12.0 - 15.0 g/dL   HCT 13.239.9 44.036.0 - 10.246.0 %   MCV 91.9 78.0 - 100.0 fL   MCH 29.0 26.0 - 34.0 pg   MCHC 31.6 30.0 - 36.0 g/dL   RDW 72.512.0 36.611.5 - 44.015.5 %   Platelets 267 150 - 400 K/uL   Neutrophils Relative % 62 %   Neutro Abs 5.2 1.7 - 7.7 K/uL   Lymphocytes Relative 27 %   Lymphs Abs 2.2 0.7 - 4.0 K/uL   Monocytes Relative 5 %   Monocytes Absolute 0.4 0.1 - 1.0 K/uL   Eosinophils Relative 5 %   Eosinophils Absolute 0.4 0.0 - 0.7 K/uL  Basophils Relative 1 %   Basophils Absolute 0.1 0.0 - 0.1 K/uL  I-Stat Beta hCG blood, ED ([facility], [facility], [facility] only)  Result Value Ref Range   I-stat hCG, quantitative <5.0 <5 mIU/mL   Comment 3          I-Stat Chem 8, ED  Result Value Ref Range   Sodium 142 135 - 145 mmol/L   Potassium 3.3 (L) 3.5 - 5.1 mmol/L   Chloride 102 101 - 111 mmol/L   BUN 11 6 - 20 mg/dL   Creatinine, Ser 9.52 0.44 - 1.00 mg/dL   Glucose, Bld 90 65 - 99 mg/dL   Calcium, Ion 8.41 3.24 - 1.40 mmol/L   TCO2 26 22 - 32 mmol/L   Hemoglobin 14.3 12.0 - 15.0 g/dL   HCT 40.1 02.7 - 25.3 %    Labs Reviewed  URINE CYTOLOGY ANCILLARY ONLY    No results found.     ASSESSMENT & PLAN:  1. Dental caries   2. STD exposure     Meds ordered this encounter  Medications  .  cefTRIAXone (ROCEPHIN) injection 250 mg  . azithromycin (ZITHROMAX) tablet 1,000 mg    Reviewed expectations re: course of current medical issues. Questions answered. Outlined signs and symptoms indicating need for more acute intervention. Patient verbalized understanding. After Visit Summary given.    Procedures:      Elvina Sidle, MD 02/26/18 1341

## 2018-02-26 NOTE — Discharge Instructions (Signed)
Try to get a dental appointment.

## 2018-02-26 NOTE — ED Triage Notes (Signed)
Pt has right upper lip and jaw swelling that started three days ago.  She states the gum under her lip is very sore.   Pt also reports vaginal discharge x1 week she thought was possible yeast, but she states she got a call from her exboyfriend and he states he has been having penile pain so she wants to be tested for STD's.

## 2018-03-01 LAB — URINE CYTOLOGY ANCILLARY ONLY
Chlamydia: NEGATIVE
Neisseria Gonorrhea: NEGATIVE
Trichomonas: NEGATIVE

## 2018-03-03 LAB — URINE CYTOLOGY ANCILLARY ONLY: Candida vaginitis: NEGATIVE

## 2019-03-22 ENCOUNTER — Ambulatory Visit (HOSPITAL_COMMUNITY)
Admission: EM | Admit: 2019-03-22 | Discharge: 2019-03-22 | Disposition: A | Discharge status: Home / Self Care | Payer: Self-pay | Attending: Family Medicine | Admitting: Family Medicine

## 2019-03-22 ENCOUNTER — Encounter (HOSPITAL_COMMUNITY): Payer: Self-pay | Admitting: Emergency Medicine

## 2019-03-22 ENCOUNTER — Other Ambulatory Visit: Payer: Self-pay

## 2019-03-22 ENCOUNTER — Ambulatory Visit (INDEPENDENT_AMBULATORY_CARE_PROVIDER_SITE_OTHER): Payer: Self-pay

## 2019-03-22 DIAGNOSIS — S93401A Sprain of unspecified ligament of right ankle, initial encounter: Secondary | ICD-10-CM

## 2019-03-22 DIAGNOSIS — S99911A Unspecified injury of right ankle, initial encounter: Secondary | ICD-10-CM

## 2019-03-22 DIAGNOSIS — W2219XA Striking against or struck by other automobile airbag, initial encounter: Secondary | ICD-10-CM

## 2019-03-22 MED ORDER — IBUPROFEN 800 MG PO TABS: 800.0000 mg | ORAL_TABLET | Freq: Three times a day (TID) | ORAL | 0 refills | Status: DC

## 2019-03-22 NOTE — Discharge Instructions (Addendum)
You have been diagnosed with an ankle sprain today. Please wear the ankle brace provided for the next week. If crutches were provided, please use them to remain non weight bearing for the next 2 days. After this, you may gradually begin to bear weight as tolerated. If possible, elevate your ankle when seated. Applying ice for 20 minutes at a time every hour as needed may help with any pain for swelling you may have. You may also  take Ibuprofen 3 times daily for pain and inflammation. Follow up with your doctor or an orthopaedist in 1 week if you are not seeing significant improvement. °
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
## 2019-10-24 ENCOUNTER — Other Ambulatory Visit: Payer: Self-pay

## 2019-10-24 ENCOUNTER — Ambulatory Visit (HOSPITAL_COMMUNITY)
Admission: EM | Admit: 2019-10-24 | Discharge: 2019-10-24 | Disposition: A | Discharge status: Home / Self Care | Payer: BC Managed Care – PPO | Attending: Family Medicine | Admitting: Family Medicine

## 2019-10-24 ENCOUNTER — Encounter (HOSPITAL_COMMUNITY): Payer: Self-pay

## 2019-10-24 DIAGNOSIS — Z3202 Encounter for pregnancy test, result negative: Secondary | ICD-10-CM

## 2019-10-24 DIAGNOSIS — Z202 Contact with and (suspected) exposure to infections with a predominantly sexual mode of transmission: Secondary | ICD-10-CM

## 2019-10-24 DIAGNOSIS — Z113 Encounter for screening for infections with a predominantly sexual mode of transmission: Secondary | ICD-10-CM

## 2019-10-24 DIAGNOSIS — N898 Other specified noninflammatory disorders of vagina: Secondary | ICD-10-CM

## 2019-10-24 LAB — POCT PREGNANCY, URINE: Preg Test, Ur: NEGATIVE

## 2019-10-24 MED ORDER — CEFTRIAXONE SODIUM 250 MG IJ SOLR
INTRAMUSCULAR | Status: AC
  Filled 2019-10-24: qty 250

## 2019-10-24 MED ORDER — AZITHROMYCIN 250 MG PO TABS
ORAL_TABLET | ORAL | Status: AC
  Filled 2019-10-24: qty 4

## 2019-10-24 MED ORDER — FLUCONAZOLE 150 MG PO TABS: 150.0000 mg | ORAL_TABLET | Freq: Once | ORAL | 0 refills | Status: AC

## 2019-10-24 MED ORDER — CEFTRIAXONE SODIUM 250 MG IJ SOLR
250.0000 mg | Freq: Once | INTRAMUSCULAR | Status: AC
  Administered 2019-10-24: 250 mg via INTRAMUSCULAR

## 2019-10-24 MED ORDER — AZITHROMYCIN 250 MG PO TABS
1000.0000 mg | ORAL_TABLET | Freq: Once | ORAL | Status: AC
  Administered 2019-10-24: 1000 mg via ORAL

## 2019-10-24 NOTE — ED Triage Notes (Signed)
Pt presents for STD Testing after having vaginal discharge for past few days; pt states she thought she had a yeast infection and bought OTC creams that did not help.

## 2019-10-24 NOTE — Discharge Instructions (Signed)
We have treated you today for gonorrhea and chlamydia, with rocephin and azithromycin. Please refrain from sexual intercourse for 7 days while medicines eliminating infection.   Take 1 tablet of diflucan to treat for yeast, may repeat and take 2nd tablet if swab positive for yeast and still having symptoms in 3-4 days  We are testing you for Gonorrhea, Chlamydia, Trichomonas, Yeast and Bacterial Vaginosis. We will call you if anything is positive and let you know if you require any further treatment. Please inform partners of any positive results.   Please return if symptoms not improving with treatment, development of fever, nausea, vomiting, abdominal pain.

## 2019-10-24 NOTE — ED Provider Notes (Addendum)
[place]    CSN: [id number] Arrival date & time: 10/24/19  3295      History   Chief Complaint Chief Complaint  Patient presents with  . STD Testing    HPI Bianca Montgomery is a 33 y.o. female history of tubal ligation presenting today for evaluation of discharge.  Patient states that over the past 2 weeks she has had abnormal vaginal discharge.  She initially thought her symptoms were related to yeast and used over-the-counter Monistat.  This is not helped her symptoms and her symptoms have persisted.  She is also had associated itching and irritation.  Denies urinary symptoms of dysuria, increased frequency or urgency.  She is concerned about STDs as she notes that her ex partner was having some discharge at one point when they were together.  Last menstrual cycle was last month.   HPI  History reviewed. No pertinent past medical history.  There are no active problems to display for this patient.   Past Surgical History:  Procedure Laterality Date  . TUBAL LIGATION  03/26/2012    OB History   No obstetric history on file.      Home Medications    Prior to Admission medications   Medication Sig Start Date End Date Taking? Authorizing Provider  fluconazole (DIFLUCAN) 150 MG tablet Take 1 tablet (150 mg total) by mouth once for 1 dose. 10/24/19 10/24/19  Oris Staffieri C, PA-C  fluticasone (FLONASE) 50 MCG/ACT nasal spray Place 1 spray into both nostrils 2 (two) times daily. 03/18/16   Billy Fischer, MD  ibuprofen (ADVIL,MOTRIN) 800 MG tablet Take 1 tablet (800 mg total) by mouth 3 (three) times daily with meals. 03/22/19   Vanessa Kick, MD    Family History Family History  Problem Relation Age of Onset  . Healthy Mother   . Healthy Father     Social History Social History   Tobacco Use  . Smoking status: Current Every Day Smoker    Packs/day: 0.10    Types: Cigarettes  . Smokeless tobacco: Never Used  Substance Use Topics  . Alcohol use: No   . Drug use: No     Allergies   Shellfish allergy   Review of Systems Review of Systems  Constitutional: Negative for fever.  Respiratory: Negative for shortness of breath.   Cardiovascular: Negative for chest pain.  Gastrointestinal: Negative for abdominal pain, diarrhea, nausea and vomiting.  Genitourinary: Positive for vaginal discharge. Negative for dysuria, flank pain, frequency, genital sores, hematuria, menstrual problem, vaginal bleeding and vaginal pain.  Musculoskeletal: Negative for back pain.  Skin: Negative for rash.  Neurological: Negative for dizziness, light-headedness and headaches.     Physical Exam Triage Vital Signs ED Triage Vitals  Enc Vitals Group     BP 10/24/19 0829 121/76     Pulse Rate 10/24/19 0829 86     Resp 10/24/19 0829 16     Temp 10/24/19 0829 98 F (36.7 C)     Temp Source 10/24/19 0829 Temporal     SpO2 10/24/19 0829 100 %     Weight --      Height --      Head Circumference --      Peak Flow --      Pain Score 10/24/19 0832 0     Pain Loc --      Pain Edu? --      Excl. in [place]? --    No data found.  Updated Vital Signs BP  121/76 (BP Location: Left Arm)   Pulse 86   Temp 98 F (36.7 C) (Temporal)   Resp 16   LMP 10/12/2019   SpO2 100%   Visual Acuity Right Eye Distance:   Left Eye Distance:   Bilateral Distance:    Right Eye Near:   Left Eye Near:    Bilateral Near:     Physical Exam Vitals signs and nursing note reviewed.  Constitutional:      General: She is not in acute distress.    Appearance: She is well-developed.  HENT:     Head: Normocephalic and atraumatic.  Eyes:     Conjunctiva/sclera: Conjunctivae normal.  Neck:     Musculoskeletal: Neck supple.  Cardiovascular:     Rate and Rhythm: Normal rate and regular rhythm.     Heart sounds: No murmur.  Pulmonary:     Effort: Pulmonary effort is normal. No respiratory distress.     Breath sounds: Normal breath sounds.  Abdominal:     Palpations:  Abdomen is soft.     Tenderness: There is no abdominal tenderness.     Comments: Soft, nondistended, nontender to light and deep palpation throughout entire abdomen and suprapubic area  Genitourinary:    Comments: Deferred Skin:    General: Skin is warm and dry.  Neurological:     Mental Status: She is alert.      UC Treatments / Results  Labs (all labs ordered are listed, but only abnormal results are displayed) Labs Reviewed  POC URINE PREG, ED  CERVICOVAGINAL ANCILLARY ONLY    EKG   Radiology No results found.  Procedures Procedures (including critical care time)  Medications Ordered in UC Medications  azithromycin (ZITHROMAX) tablet 1,000 mg (has no administration in time range)  cefTRIAXone (ROCEPHIN) injection 250 mg (has no administration in time range)    Initial Impression / Assessment and Plan / UC Course  I have reviewed the triage vital signs and the nursing notes.  Pertinent labs & imaging results that were available during my care of the patient were reviewed by me and considered in my medical decision making (see chart for details).    Pregnancy test negative. Will empirically treat today for yeast with Diflucan.  Given patient's concern for STDs and she states that she will not be able to return for treatment if positive will go ahead and treat today for gonorrhea and chlamydia with Rocephin and azithromycin.  Vaginal swab obtained, will send off to confirm as well as check for other causes of discharge.  Will call with results and provide further treatment if needed.Discussed strict return precautions. Patient verbalized understanding and is agreeable with plan.    Final Clinical Impressions(s) / UC Diagnoses   Final diagnoses:  Vaginal discharge  Screen for STD (sexually transmitted disease)     Discharge Instructions     We have treated you today for gonorrhea and chlamydia, with rocephin and azithromycin. Please refrain from sexual  intercourse for 7 days while medicines eliminating infection.   Take 1 tablet of diflucan to treat for yeast, may repeat and take 2nd tablet if swab positive for yeast and still having symptoms in 3-4 days  We are testing you for Gonorrhea, Chlamydia, Trichomonas, Yeast and Bacterial Vaginosis. We will call you if anything is positive and let you know if you require any further treatment. Please inform partners of any positive results.   Please return if symptoms not improving with treatment, development of fever, nausea, vomiting, abdominal  pain.    ED Prescriptions    Medication Sig Dispense Auth. Provider   fluconazole (DIFLUCAN) 150 MG tablet Take 1 tablet (150 mg total) by mouth once for 1 dose. 2 tablet Callen Zuba, Dixon C, PA-C     PDMP not reviewed this encounter.   Lew Dawes, PA-C 10/24/19 0908    Patterson Hammersmith C, PA-C 10/24/19 [phone]

## 2019-10-27 LAB — CERVICOVAGINAL ANCILLARY ONLY
Bacterial vaginitis: POSITIVE — AB
Candida vaginitis: POSITIVE — AB
Chlamydia: POSITIVE — AB
Neisseria Gonorrhea: NEGATIVE
Trichomonas: NEGATIVE

## 2019-10-28 ENCOUNTER — Telehealth: Payer: Self-pay | Admitting: Emergency Medicine

## 2019-10-28 MED ORDER — METRONIDAZOLE 500 MG PO TABS: 500.0000 mg | ORAL_TABLET | Freq: Two times a day (BID) | ORAL | 0 refills | Status: AC

## 2019-10-28 NOTE — Telephone Encounter (Signed)
Bacterial vaginosis is positive. This was not treated at the urgent care visit.  Flagyl 500 mg BID x 7 days #14 no refills sent to patients pharmacy of choice.    Chlamydia is positive.  This was treated at the urgent care visit with po zithromax 1g.  Pt needs education to please refrain from sexual intercourse for 7 days to give the medicine time to work.  Sexual partners need to be notified and tested/treated.  Condoms may reduce risk of reinfection.  Recheck or followup with PCP for further evaluation if symptoms are not improving.  [facility] notified.  Candida (yeast) is positive.  Prescription for fluconazole was given at the urgent care visit.    Patient contacted and made aware of    results. Pt verbalized understanding and had all questions answered.

## 2020-06-27 ENCOUNTER — Emergency Department (HOSPITAL_COMMUNITY)
Admission: EM | Admit: 2020-06-27 | Discharge: 2020-06-27 | Disposition: A | Discharge status: Home / Self Care | Payer: BC Managed Care – PPO | Attending: Emergency Medicine | Admitting: Emergency Medicine

## 2020-06-27 ENCOUNTER — Other Ambulatory Visit: Payer: Self-pay

## 2020-06-27 ENCOUNTER — Encounter (HOSPITAL_COMMUNITY): Payer: Self-pay | Admitting: Emergency Medicine

## 2020-06-27 DIAGNOSIS — F17210 Nicotine dependence, cigarettes, uncomplicated: Secondary | ICD-10-CM | POA: Insufficient documentation

## 2020-06-27 DIAGNOSIS — K0889 Other specified disorders of teeth and supporting structures: Secondary | ICD-10-CM

## 2020-06-27 MED ORDER — NAPROXEN 500 MG PO TABS: 500.0000 mg | ORAL_TABLET | Freq: Two times a day (BID) | ORAL | 0 refills | Status: DC

## 2020-06-27 MED ORDER — CLINDAMYCIN HCL 150 MG PO CAPS
300.0000 mg | ORAL_CAPSULE | Freq: Once | ORAL | Status: AC
  Administered 2020-06-27: 17:00:00 300 mg via ORAL
  Filled 2020-06-27: qty 2

## 2020-06-27 MED ORDER — NAPROXEN 250 MG PO TABS
500.0000 mg | ORAL_TABLET | Freq: Once | ORAL | Status: AC
  Administered 2020-06-27: 17:00:00 500 mg via ORAL
  Filled 2020-06-27: qty 2

## 2020-06-27 MED ORDER — CLINDAMYCIN HCL 300 MG PO CAPS
300.0000 mg | ORAL_CAPSULE | Freq: Three times a day (TID) | ORAL | 0 refills | Status: DC
Start: 2020-06-27 — End: 2022-01-21

## 2020-06-27 NOTE — ED Provider Notes (Signed)
[facility] EMERGENCY DEPARTMENT Provider Note   CSN: [id number] Arrival date & time: 06/27/20  1538     History Chief Complaint  Patient presents with  . Dental Pain    Bianca Montgomery is a 34 y.o. female who presents with dental pain.  Patient states for the past 2 days she has had gradually worsening pain in the right lower molar.  She states that the tooth is broken and she went to the dollar store to buy a filling which she put in.  She states that it hurts about she cannot even touch the area to get the filling removed.  She is taking ibuprofen without significant relief.  Today she woke up with swelling of the right lower jaw and so decided to come to the ER.  She does not currently have a dentist.  She denies fever or inability to swallow.  HPI     History reviewed. No pertinent past medical history.  There are no problems to display for this patient.   Past Surgical History:  Procedure Laterality Date  . TUBAL LIGATION  03/26/2012     OB History   No obstetric history on file.     Family History  Problem Relation Age of Onset  . Healthy Mother   . Healthy Father     Social History   Tobacco Use  . Smoking status: Current Every Day Smoker    Packs/day: 0.10    Types: Cigarettes  . Smokeless tobacco: Never Used  Vaping Use  . Vaping Use: Never used  Substance Use Topics  . Alcohol use: No  . Drug use: No    Home Medications Prior to Admission medications   Medication Sig Start Date End Date Taking? Authorizing Provider  clindamycin (CLEOCIN) 300 MG capsule Take 1 capsule (300 mg total) by mouth 3 (three) times daily. 06/27/20   Bethel Born, PA-C  fluticasone (FLONASE) 50 MCG/ACT nasal spray Place 1 spray into both nostrils 2 (two) times daily. 03/18/16   Linna Hoff, MD  ibuprofen (ADVIL,MOTRIN) 800 MG tablet Take 1 tablet (800 mg total) by mouth 3 (three) times daily with meals. 03/22/19   Mardella Layman, MD  naproxen  (NAPROSYN) 500 MG tablet Take 1 tablet (500 mg total) by mouth 2 (two) times daily. 06/27/20   Bethel Born, PA-C    Allergies    Shellfish allergy  Review of Systems   Review of Systems  Constitutional: Negative for fever.  HENT: Positive for dental problem and facial swelling.     Physical Exam Updated Vital Signs BP 137/62 (BP Location: Right Arm)   Pulse 87   Temp 98.9 F (37.2 C) (Oral)   Resp 18   SpO2 100%   Physical Exam Vitals and nursing note reviewed.  Constitutional:      General: She is not in acute distress.    Appearance: Normal appearance. She is well-developed. She is not ill-appearing.  HENT:     Head: Normocephalic and atraumatic.     Mouth/Throat:     Mouth: Mucous membranes are moist.     Dentition: Abnormal dentition.     Pharynx: Oropharynx is clear.      Comments: Mild focal swelling over the right lower jaw Eyes:     General: No scleral icterus.       Right eye: No discharge.        Left eye: No discharge.     Conjunctiva/sclera: Conjunctivae normal.  Pupils: Pupils are equal, round, and reactive to light.  Cardiovascular:     Rate and Rhythm: Normal rate.  Pulmonary:     Effort: Pulmonary effort is normal. No respiratory distress.  Abdominal:     General: There is no distension.  Musculoskeletal:     Cervical back: Normal range of motion.  Skin:    General: Skin is warm and dry.  Neurological:     Mental Status: She is alert and oriented to person, place, and time.  Psychiatric:        Behavior: Behavior normal.     ED Results / Procedures / Treatments   Labs (all labs ordered are listed, but only abnormal results are displayed) Labs Reviewed - No data to display  EKG None  Radiology No results found.  Procedures Procedures (including critical care time)  Medications Ordered in ED Medications  naproxen (NAPROSYN) tablet 500 mg (has no administration in time range)  clindamycin (CLEOCIN) capsule 300 mg (has no  administration in time range)    ED Course  I have reviewed the triage vital signs and the nursing notes.  Pertinent labs & imaging results that were available during my care of the patient were reviewed by me and considered in my medical decision making (see chart for details).  34 year old presents with dental pain. Patient is afebrile, non toxic appearing, and swallowing secretions well. No concerning findings on exam. No obvious drainable abscess and doubt deep space head or neck infection. Will tx with antibiotics and NSAIDs. I gave patient referral to dentist and stressed the importance of dental follow up for ultimate management of dental pain. Discussed findings, treatment, and follow up  with patient.  Pt given return precautions.  Pt verbalizes understanding and agrees with plan.   MDM Rules/Calculators/A&P                           Final Clinical Impression(s) / ED Diagnoses Final diagnoses:  Pain, dental    Rx / DC Orders ED Discharge Orders         Ordered    clindamycin (CLEOCIN) 300 MG capsule  3 times daily     Discontinue  Reprint     06/27/20 1713    naproxen (NAPROSYN) 500 MG tablet  2 times daily     Discontinue  Reprint     06/27/20 1713           Bethel Born, PA-C 06/27/20 1719    Mancel Bale, MD 06/27/20 2208

## 2020-06-27 NOTE — ED Notes (Signed)
Patient verbalizes understanding of discharge instructions. Opportunity for questioning and answers were provided. Armband removed by staff, pt discharged from ED.  

## 2020-06-27 NOTE — ED Triage Notes (Signed)
Pt states the last 2 days she has had dental pain and woke up with swelling in right side of mouth feels like she has an abscess.

## 2020-06-27 NOTE — Discharge Instructions (Signed)
Start Clindamycin - take three times daily for 10 days for infection Take Naproxen twice a day for pain and inflammation Please follow up with a dentist

## 2020-11-22 ENCOUNTER — Emergency Department (HOSPITAL_COMMUNITY)
Admission: EM | Admit: 2020-11-22 | Discharge: 2020-11-22 | Disposition: A | Discharge status: Home / Self Care | Payer: Self-pay | Attending: Emergency Medicine | Admitting: Emergency Medicine

## 2020-11-22 ENCOUNTER — Encounter (HOSPITAL_COMMUNITY): Payer: Self-pay | Admitting: Emergency Medicine

## 2020-11-22 DIAGNOSIS — Z79899 Other long term (current) drug therapy: Secondary | ICD-10-CM | POA: Insufficient documentation

## 2020-11-22 DIAGNOSIS — K047 Periapical abscess without sinus: Secondary | ICD-10-CM | POA: Insufficient documentation

## 2020-11-22 DIAGNOSIS — F17210 Nicotine dependence, cigarettes, uncomplicated: Secondary | ICD-10-CM | POA: Insufficient documentation

## 2020-11-22 DIAGNOSIS — G44319 Acute post-traumatic headache, not intractable: Secondary | ICD-10-CM | POA: Insufficient documentation

## 2020-11-22 DIAGNOSIS — R519 Headache, unspecified: Secondary | ICD-10-CM

## 2020-11-22 LAB — CBC WITH DIFFERENTIAL/PLATELET
Abs Immature Granulocytes: 0.02 10*3/uL (ref 0.00–0.07)
Basophils Absolute: 0.1 10*3/uL (ref 0.0–0.1)
Basophils Relative: 1 %
Eosinophils Absolute: 0.5 10*3/uL (ref 0.0–0.5)
Eosinophils Relative: 5 %
HCT: 41.2 % (ref 36.0–46.0)
Hemoglobin: 12.7 g/dL (ref 12.0–15.0)
Immature Granulocytes: 0 %
Lymphocytes Relative: 18 %
Lymphs Abs: 1.8 10*3/uL (ref 0.7–4.0)
MCH: 29.3 pg (ref 26.0–34.0)
MCHC: 30.8 g/dL (ref 30.0–36.0)
MCV: 95.2 fL (ref 80.0–100.0)
Monocytes Absolute: 0.8 10*3/uL (ref 0.1–1.0)
Monocytes Relative: 8 %
Neutro Abs: 7.1 10*3/uL (ref 1.7–7.7)
Neutrophils Relative %: 68 %
Platelets: 268 10*3/uL (ref 150–400)
RBC: 4.33 MIL/uL (ref 3.87–5.11)
RDW: 11.5 % (ref 11.5–15.5)
WBC: 10.3 10*3/uL (ref 4.0–10.5)
nRBC: 0 % (ref 0.0–0.2)

## 2020-11-22 LAB — BASIC METABOLIC PANEL
Anion gap: 12 (ref 5–15)
BUN: 8 mg/dL (ref 6–20)
CO2: 23 mmol/L (ref 22–32)
Calcium: 9.2 mg/dL (ref 8.9–10.3)
Chloride: 103 mmol/L (ref 98–111)
Creatinine, Ser: 0.77 mg/dL (ref 0.44–1.00)
GFR, Estimated: >60 mL/min (ref >60)
Glucose, Bld: 86 mg/dL (ref 70–99)
Potassium: 3.8 mmol/L (ref 3.5–5.1)
Sodium: 138 mmol/L (ref 135–145)

## 2020-11-22 LAB — I-STAT BETA HCG BLOOD, ED (MC, WL, AP ONLY): I-stat hCG, quantitative: <5 m[IU]/mL (ref <5)

## 2020-11-22 LAB — MAGNESIUM: Magnesium: 2 mg/dL (ref 1.7–2.4)

## 2020-11-22 MED ORDER — METOCLOPRAMIDE HCL 5 MG/ML IJ SOLN
10.0000 mg | Freq: Once | INTRAMUSCULAR | Status: AC
  Administered 2020-11-22: 10 mg via INTRAVENOUS
  Filled 2020-11-22: qty 2

## 2020-11-22 MED ORDER — DEXAMETHASONE SODIUM PHOSPHATE 10 MG/ML IJ SOLN
10.0000 mg | Freq: Once | INTRAMUSCULAR | Status: AC
  Administered 2020-11-22: 10 mg via INTRAVENOUS
  Filled 2020-11-22: qty 1

## 2020-11-22 MED ORDER — ACETAMINOPHEN 500 MG PO TABS
1000.0000 mg | ORAL_TABLET | Freq: Once | ORAL | Status: AC
  Administered 2020-11-22: 1000 mg via ORAL
  Filled 2020-11-22: qty 2

## 2020-11-22 MED ORDER — AMOXICILLIN-POT CLAVULANATE 875-125 MG PO TABS: 1.0000 | ORAL_TABLET | Freq: Two times a day (BID) | ORAL | 0 refills | Status: AC

## 2020-11-22 MED ORDER — BUPIVACAINE-EPINEPHRINE (PF) 0.5% -1:200000 IJ SOLN
1.8000 mL | Freq: Once | INTRAMUSCULAR | Status: AC
  Administered 2020-11-22: 1.8 mL
  Filled 2020-11-22: qty 1.8

## 2020-11-22 MED ORDER — LACTATED RINGERS IV BOLUS
1000.0000 mL | Freq: Once | INTRAVENOUS | Status: AC
  Administered 2020-11-22: 1000 mL via INTRAVENOUS

## 2020-11-22 NOTE — ED Notes (Signed)
Patient speaking on personal cell phone, airway clear and patent. NAD noted.

## 2020-11-22 NOTE — ED Notes (Signed)
ED Provider at bedside. 

## 2020-11-22 NOTE — ED Triage Notes (Signed)
Pt reports severe headache, onset yesterday. States now having neck pain. Woke up this am with her bottom right jaw swollen. Hx of migraines. Took tylenol, asa and bc powder without relief. A/ox4, resp e/u, nad.

## 2020-11-22 NOTE — ED Provider Notes (Signed)
[facility] EMERGENCY DEPARTMENT Provider Note   CSN: [id number] Arrival date & time: 11/22/20  1146     History Chief Complaint  Patient presents with  . Headache    Bianca Montgomery is a 34 y.o. female presents with 3 days of waxing waning headache that noted radiates down her right neck.  Gradual onset, similar headaches.  Today, she noticed right lower facial swelling similar to dental abscess she had 06/2020.  Denies any fevers, numbness, tingling, weakness, chest pain, shortness of breath.  The history is provided by the patient.  Headache Pain location:  Generalized Quality:  Unable to specify Radiates to:  R neck Pain severity now: More severe yesterday, improving today. Duration:  3 days Timing:  Constant Progression:  Waxing and waning Chronicity:  New Similar to prior headaches: yes   Relieved by:  Nothing Worsened by:  Activity, light and sound Ineffective treatments:  Acetaminophen, NSAIDs and aspirin Associated symptoms: nausea, neck pain, neck stiffness, photophobia and syncope   Associated symptoms: no abdominal pain, no blurred vision, no congestion, no cough, no diarrhea, no dizziness, no fever, no focal weakness, no loss of balance, no paresthesias and no vomiting        History reviewed. No pertinent past medical history.  There are no problems to display for this patient.   Past Surgical History:  Procedure Laterality Date  . TUBAL LIGATION  03/26/2012     OB History   No obstetric history on file.     Family History  Problem Relation Age of Onset  . Healthy Mother   . Healthy Father     Social History   Tobacco Use  . Smoking status: Current Every Day Smoker    Packs/day: 0.10    Types: Cigarettes  . Smokeless tobacco: Never Used  Vaping Use  . Vaping Use: Never used  Substance Use Topics  . Alcohol use: No  . Drug use: No    Home Medications Prior to Admission medications   Medication Sig Start Date End  Date Taking? Authorizing Provider  amoxicillin-clavulanate (AUGMENTIN) 875-125 MG tablet Take 1 tablet by mouth every 12 (twelve) hours for 10 days. 11/22/20 12/02/20  Donie Moulton, MD  clindamycin (CLEOCIN) 300 MG capsule Take 1 capsule (300 mg total) by mouth 3 (three) times daily. 06/27/20   Bethel Born, PA-C  fluticasone (FLONASE) 50 MCG/ACT nasal spray Place 1 spray into both nostrils 2 (two) times daily. 03/18/16   Linna Hoff, MD  ibuprofen (ADVIL,MOTRIN) 800 MG tablet Take 1 tablet (800 mg total) by mouth 3 (three) times daily with meals. 03/22/19   Mardella Layman, MD  naproxen (NAPROSYN) 500 MG tablet Take 1 tablet (500 mg total) by mouth 2 (two) times daily. 06/27/20   Bethel Born, PA-C    Allergies    Shellfish allergy  Review of Systems   Review of Systems  Constitutional: Negative for fever.  HENT: Negative for congestion.   Eyes: Positive for photophobia. Negative for blurred vision.  Respiratory: Negative for cough.   Cardiovascular: Positive for syncope.  Gastrointestinal: Positive for nausea. Negative for abdominal pain, diarrhea and vomiting.  Musculoskeletal: Positive for neck pain and neck stiffness.  Neurological: Positive for headaches. Negative for dizziness, focal weakness, paresthesias and loss of balance.    Physical Exam Updated Vital Signs BP 116/64 (BP Location: Right Arm)   Pulse 72   Temp 98.1 F (36.7 C) (Oral)   Resp 18   Ht 5\' 8"  (1.727  m)   Wt 63.5 kg   SpO2 99%   BMI 21.29 kg/m   Physical Exam Vitals and nursing note reviewed.  Constitutional:      General: She is not in acute distress.    Appearance: She is well-developed and well-nourished. She is not ill-appearing or toxic-appearing.  HENT:     Head: Normocephalic and atraumatic.     Mouth/Throat:     Dentition: Dental tenderness, dental caries and dental abscesses present.     Tongue: No lesions. Tongue does not deviate from midline.     Palate: No mass and lesions.      Pharynx: Oropharynx is clear. Uvula midline. No pharyngeal swelling, oropharyngeal exudate, posterior oropharyngeal erythema or uvula swelling.     Tonsils: No tonsillar exudate or tonsillar abscesses.      Comments: Right lower cheek nontender but with mild swelling. No neck swelling noted. Does have anterior chain cervical lymphadenopathy. Eyes:     Extraocular Movements: Extraocular movements intact.     Right eye: Normal extraocular motion.     Left eye: Normal extraocular motion.     Conjunctiva/sclera: Conjunctivae normal.     Pupils: Pupils are equal, round, and reactive to light.  Cardiovascular:     Rate and Rhythm: Normal rate and regular rhythm.     Heart sounds: No murmur heard.   Pulmonary:     Effort: Pulmonary effort is normal. No respiratory distress.     Breath sounds: Normal breath sounds. No stridor. No wheezing, rhonchi or rales.  Abdominal:     General: There is no distension.     Palpations: Abdomen is soft.     Tenderness: There is no abdominal tenderness.  Musculoskeletal:        General: No edema.     Cervical back: Normal range of motion and neck supple. No rigidity.  Lymphadenopathy:     Cervical: Cervical adenopathy present.  Skin:    General: Skin is warm and dry.  Neurological:     Mental Status: She is alert and oriented to person, place, and time. Mental status is at baseline.     GCS: GCS eye subscore is 4. GCS verbal subscore is 5. GCS motor subscore is 6.     Cranial Nerves: No cranial nerve deficit, dysarthria or facial asymmetry.     Sensory: No sensory deficit.     Motor: No weakness.     Coordination: Coordination normal.     Gait: Gait normal.  Psychiatric:        Mood and Affect: Mood and affect and mood normal.        Speech: Speech normal.        Behavior: Behavior normal.     ED Results / Procedures / Treatments   Labs (all labs ordered are listed, but only abnormal results are displayed) Labs Reviewed  CBC WITH  DIFFERENTIAL/PLATELET  BASIC METABOLIC PANEL  MAGNESIUM  I-STAT BETA HCG BLOOD, ED ([facility], [facility], [facility] ONLY)    EKG EKG Interpretation  Date/Time:  Thursday November 22 2020 19:37:33 EST Ventricular Rate:  77 PR Interval:    QRS Duration: 87 QT Interval:  381 QTC Calculation: 432 R Axis:   53 Text Interpretation: Sinus rhythm Borderline T abnormalities, anterior leads Confirmed by Cherlynn Perches ([id number]) on 11/23/2020 9:07:30 AM   Radiology No results found.  Procedures Procedures (including critical care time)  Medications Ordered in ED Medications  bupivacaine-epinephrine (MARCAINE W/ EPI) 0.5% -1:200000 injection 1.8 mL (1.8 mLs Infiltration Given  11/22/20 1812)  lactated ringers bolus 1,000 mL (0 mLs Intravenous Stopped 11/22/20 2000)  acetaminophen (TYLENOL) tablet 1,000 mg (1,000 mg Oral Given 11/22/20 1813)  metoCLOPramide (REGLAN) injection 10 mg (10 mg Intravenous Given 11/22/20 1812)  dexamethasone (DECADRON) injection 10 mg (10 mg Intravenous Given 11/22/20 1813)    ED Course  I have reviewed the triage vital signs and the nursing notes.  Pertinent labs & imaging results that were available during my care of the patient were reviewed by me and considered in my medical decision making (see chart for details).    MDM Rules/Calculators/A&P                          MDM: Bianca Montgomery is a 34 y.o. female who presents with headache and dental abscess as per above. I have reviewed the nursing documentation for past medical history, family history, and social history. Pertinent previous records reviewed. She is awake, alert. HDS. Afebrile. Physical exam is most notable for right lower dental abscess and normal neuro exam with normal gait.  Labs: hCG negative.  Mag, BMP, and CBC within normal limits. EKG: NSR. QTc, PR, and QRS within appropriate limits. No signs of acute ischemia, infarct, or significant electrical abnormalities. No STEMI, ST depressions, or significant T wave  inversions. No evidence of a High-Grade Conduction Block, WPW, Brugada Sign, ARVC, DeWinters T Waves, or Wellens Waves. Consults: none Tx: Migraine cocktail with the milligrams IV Reglan, 10 mg IV Decadron, 1000 mg p.o. Tylenol, 1 L LR bolus.  Bupivacaine/epi not given.  Differential Dx: I am most concerned for migraine headache and dental abscess. Given history, physical exam, and work-up, I do not think she has Ludwick's angina, deep space neck infection, cervical vertebral artery dissection, stroke, meningitis/encephalitis, intracranial trauma, cerebral venous thrombosis, or airway compromise.  MDM: Bianca Montgomery is a 34 y.o. female presents with a headache similar to prior as well as right lower jaw swelling found to have a dental abscess.  Patient has a normal neuro exam, supple neck, without any concerning signs or symptoms.  Patient given migraine cocktail which she states resolved her headache.  Her dental abscess had a pustule that was ruptured and drained bloody purulence.  She reported significant improvement in pain after it drained.  Remainder of purulence was milked from abscess.  Patient initially was minimal to a dental block but then refused.  Also recommended numbing the abscess and using a scalpel to make a larger incision to prevent the abscess from closing up again.  Patient voiced understanding of her/benefits but politely refused this as well.  She voiced understanding that the abscess could return that she may need to seek additional care if this were to happen.  On reassessment, patient requesting discharge home.  Overall well-appearing, no new neuro deficits.  Headache has resolved, abscess drained.  Discharged home with prescription for 10 days of Augmentin twice daily.  Strict return precautions provided. Encouraged her to follow-up with her PCP on an outpatient basis. Questions were answered.  Patient discharged in stable condition.  The plan for this patient was  discussed with Dr. Criss Alvine, who voiced agreement and who oversaw evaluation and treatment of this patient.   Final Clinical Impression(s) / ED Diagnoses Final diagnoses:  Acute nonintractable headache, unspecified headache type  Dental abscess    Rx / DC Orders ED Discharge Orders         Ordered    amoxicillin-clavulanate (AUGMENTIN) 875-125 MG tablet  Every 12 hours        11/22/20 2004           Gershon MusselHendley, Shery [place], MD 11/23/20 1944    Pricilla LovelessGoldston, Scott, MD 11/24/20 1755

## 2021-05-21 ENCOUNTER — Emergency Department (HOSPITAL_COMMUNITY)
Admission: EM | Admit: 2021-05-21 | Discharge: 2021-05-21 | Discharge status: Against Medical Advice | Payer: Self-pay | Attending: Physician Assistant | Admitting: Physician Assistant

## 2021-05-21 ENCOUNTER — Other Ambulatory Visit: Payer: Self-pay

## 2021-05-21 DIAGNOSIS — U071 COVID-19: Secondary | ICD-10-CM | POA: Insufficient documentation

## 2021-05-21 DIAGNOSIS — Z5321 Procedure and treatment not carried out due to patient leaving prior to being seen by health care provider: Secondary | ICD-10-CM | POA: Insufficient documentation

## 2021-05-21 LAB — SARS CORONAVIRUS 2 (TAT 6-24 HRS): SARS Coronavirus 2: POSITIVE — AB

## 2021-05-21 MED ORDER — ACETAMINOPHEN 500 MG PO TABS
1000.0000 mg | ORAL_TABLET | Freq: Once | ORAL | Status: AC
  Administered 2021-05-21: 1000 mg via ORAL
  Filled 2021-05-21: qty 2

## 2021-05-21 NOTE — ED Triage Notes (Signed)
Pt took home covid test this morning which was positive. Having generalized body aches and fever.

## 2021-05-21 NOTE — ED Provider Notes (Signed)
Emergency Medicine Provider Triage Evaluation Note  Bianca Montgomery , a 35 y.o. female  was evaluated in triage.  Pt complains of positive covid test at home with body aches and fatigue. She had one maderna vaccine.  No meds PTA.  She is requesting repeat covid test.   Review of Systems  Positive: Fever, myalgias Negative: Chest pain, shortness of breath  Physical Exam  BP 100/72 (BP Location: Left Arm)   Pulse (!) 117   Temp (!) 100.4 F (38 C) (Oral)   Resp 16   SpO2 100%  Gen:   Awake, no distress   Resp:  Normal effort  MSK:   Moves extremities without difficulty  Other:  Respirations are even and unlabored.  Medical Decision Making  Medically screening exam initiated at 3:25 PM.  Appropriate orders placed.  Anuradha Chabot was informed that the remainder of the evaluation will be completed by another provider, this initial triage assessment does not replace that evaluation, and the importance of remaining in the ED until their evaluation is complete.  Patient is requesting a formal COVID test. I informed her that with her test at home being positive, or having a fever and COVID-like symptoms that even if her test here comes back negative she still has COVID and needs to isolate/quarantine appropriately.  She states her understanding and still wishes for test.   Cristina Gong, PA-C 05/21/21 1527    Arby Barrette, MD 05/28/21 2209

## 2021-05-21 NOTE — ED Notes (Signed)
Pt leaving AMA. 

## 2022-01-21 ENCOUNTER — Encounter (HOSPITAL_COMMUNITY): Payer: Self-pay

## 2022-01-21 ENCOUNTER — Other Ambulatory Visit: Payer: Self-pay

## 2022-01-21 ENCOUNTER — Ambulatory Visit (HOSPITAL_COMMUNITY)
Admission: EM | Admit: 2022-01-21 | Discharge: 2022-01-21 | Disposition: A | Discharge status: Home / Self Care | Payer: 59 | Attending: Student | Admitting: Student

## 2022-01-21 DIAGNOSIS — N760 Acute vaginitis: Secondary | ICD-10-CM | POA: Diagnosis present

## 2022-01-21 DIAGNOSIS — S21032A Puncture wound without foreign body of left breast, initial encounter: Secondary | ICD-10-CM | POA: Diagnosis not present

## 2022-01-21 DIAGNOSIS — N61 Mastitis without abscess: Secondary | ICD-10-CM | POA: Insufficient documentation

## 2022-01-21 DIAGNOSIS — S21039A Puncture wound without foreign body of unspecified breast, initial encounter: Secondary | ICD-10-CM | POA: Insufficient documentation

## 2022-01-21 DIAGNOSIS — Z202 Contact with and (suspected) exposure to infections with a predominantly sexual mode of transmission: Secondary | ICD-10-CM | POA: Diagnosis present

## 2022-01-21 LAB — POC URINE PREG, ED: Preg Test, Ur: NEGATIVE

## 2022-01-21 MED ORDER — DOXYCYCLINE HYCLATE 100 MG PO CAPS
100.0000 mg | ORAL_CAPSULE | Freq: Two times a day (BID) | ORAL | 0 refills | Status: AC
Start: 2022-01-21 — End: 2022-01-28

## 2022-01-21 MED ORDER — CEFTRIAXONE SODIUM 500 MG IJ SOLR
INTRAMUSCULAR | Status: AC
  Filled 2022-01-21: qty 500

## 2022-01-21 MED ORDER — LIDOCAINE HCL (PF) 1 % IJ SOLN
INTRAMUSCULAR | Status: AC
  Filled 2022-01-21: qty 2

## 2022-01-21 MED ORDER — CEFTRIAXONE SODIUM 500 MG IJ SOLR
500.0000 mg | Freq: Once | INTRAMUSCULAR | Status: AC
Start: 2022-01-21 — End: 2022-01-21
  Administered 2022-01-21: 500 mg via INTRAMUSCULAR

## 2022-01-21 NOTE — Discharge Instructions (Addendum)
-  Doxycycline twice daily for 7 days. This will treat for both chlamydia and skin infection. Make sure to wear sunscreen while spending time outside while on this medication as it can increase your chance of sunburn. You can take this medication with food if you have a sensitive stomach. -Continue using saline rinses and salt water to cleanse the piercing. Avoid hydrogen peroxide or alcohol. Consider changing the piercing to a gold or silver metal after about a week. Follow-up if this is getting worse instead of better . -We have sent testing for sexually transmitted infections. We will notify you of any positive results once they are received. If required, we will prescribe any medications you might need. Please refrain from all sexual activity until treatment is complete.  -Seek additional medical attention if you develop fevers/chills, new/worsening abdominal pain, new/worsening vaginal discomfort/discharge, etc.

## 2022-01-21 NOTE — ED Provider Notes (Signed)
[facility]    CSN: [id number] Arrival date & time: 01/21/22  1014      History   Chief Complaint No chief complaint on file.   HPI Bianca Montgomery is a 36 y.o. female presenting with vaginal discharge x3 days following exposure to chlamydia. Also with L nipple piercing pain. Medical history tubal ligation. -States that her left nipple piercing has been bothering her for about 1 week.  She got her nipples pierced about 2 years ago, and has not changed the piercings recently.  She is unsure of the material of the piercings.  States that the area surrounding the piercing on the nipple is tender.  Has been using saline rinses and salt water rinse on the site.  Has also tried warm compress.  Denies discharge from the area. -Also with vaginal discharge for 3 days following exposure to chlamydia - female partner.  Describes this as white and malodorous.  Creamy in texture.  Denies other symptoms including abdominal pain, flank pain, fever/chills, dysuria.  States that the infected nipple piercing has been distracting her from the vaginal discharge.  HPI  History reviewed. No pertinent past medical history.  There are no problems to display for this patient.   Past Surgical History:  Procedure Laterality Date   TUBAL LIGATION  03/26/2012    OB History   No obstetric history on file.      Home Medications    Prior to Admission medications   Medication Sig Start Date End Date Taking? Authorizing Provider  doxycycline (VIBRAMYCIN) 100 MG capsule Take 1 capsule (100 mg total) by mouth 2 (two) times daily for 7 days. 01/21/22 01/28/22 Yes Hazel Sams, PA-C    Family History Family History  Problem Relation Age of Onset   Healthy Mother    Healthy Father     Social History Social History   Tobacco Use   Smoking status: Every Day    Packs/day: 0.10    Types: Cigarettes   Smokeless tobacco: Never  Vaping Use   Vaping Use: Never used  Substance Use Topics    Alcohol use: No   Drug use: No     Allergies   Shellfish allergy   Review of Systems Review of Systems  Constitutional:  Negative for chills and fever.  HENT:  Negative for sore throat.   Eyes:  Negative for pain and redness.  Respiratory:  Negative for shortness of breath.   Cardiovascular:  Negative for chest pain.  Gastrointestinal:  Negative for abdominal pain, diarrhea, nausea and vomiting.  Genitourinary:  Positive for vaginal discharge. Negative for decreased urine volume, difficulty urinating, dysuria, flank pain, frequency, genital sores, hematuria and urgency.  Musculoskeletal:  Negative for back pain.  Skin:  Negative for rash.  All other systems reviewed and are negative.   Physical Exam Triage Vital Signs ED Triage Vitals  Enc Vitals Group     BP 01/21/22 1216 118/74     Pulse Rate 01/21/22 1216 (!) 106     Resp 01/21/22 1216 18     Temp 01/21/22 1216 99.2 F (37.3 C)     Temp Source 01/21/22 1216 Oral     SpO2 01/21/22 1216 99 %     Weight --      Height --      Head Circumference --      Peak Flow --      Pain Score 01/21/22 1217 10     Pain Loc --  Pain Edu? --      Excl. in GC? --    No data found.  Updated Vital Signs BP 118/74 (BP Location: Left Arm)    Pulse (!) 106    Temp 99.2 F (37.3 C) (Oral)    Resp 18    LMP 12/30/2021    SpO2 99%   Visual Acuity Right Eye Distance:   Left Eye Distance:   Bilateral Distance:    Right Eye Near:   Left Eye Near:    Bilateral Near:     Physical Exam Vitals reviewed.  Constitutional:      General: She is not in acute distress.    Appearance: Normal appearance. She is not ill-appearing.  HENT:     Head: Normocephalic and atraumatic.     Mouth/Throat:     Mouth: Mucous membranes are moist.     Comments: Moist mucous membranes Eyes:     Extraocular Movements: Extraocular movements intact.     Pupils: Pupils are equal, round, and reactive to light.  Cardiovascular:     Rate and Rhythm:  Normal rate and regular rhythm.     Heart sounds: Normal heart sounds.  Pulmonary:     Effort: Pulmonary effort is normal.     Breath sounds: Normal breath sounds. No wheezing, rhonchi or rales.  Chest:     Chest wall: No swelling or tenderness.  Breasts:    Breasts are symmetrical.     Right: No swelling, bleeding, inverted nipple, mass, nipple discharge, skin change or tenderness.     Left: Tenderness present. No swelling, bleeding, inverted nipple, mass, nipple discharge or skin change.     Comments: L nipple- nipple ring in place. no obvious swelling or skin changes. Mildly TTP at 6 o'clock. No erythema, warmth, discharge. No mass.  Abdominal:     General: Bowel sounds are normal. There is no distension.     Palpations: Abdomen is soft. There is no mass.     Tenderness: There is no abdominal tenderness. There is no right CVA tenderness, left CVA tenderness, guarding or rebound.  Genitourinary:    Comments: deferred Skin:    General: Skin is warm.     Capillary Refill: Capillary refill takes less than 2 seconds.     Comments: Good skin turgor  Neurological:     General: No focal deficit present.     Mental Status: She is alert and oriented to person, place, and time.  Psychiatric:        Mood and Affect: Mood normal.        Behavior: Behavior normal.     UC Treatments / Results  Labs (all labs ordered are listed, but only abnormal results are displayed) Labs Reviewed  POC URINE PREG, ED  CERVICOVAGINAL ANCILLARY ONLY    EKG   Radiology No results found.  Procedures Procedures (including critical care time)  Medications Ordered in UC Medications  cefTRIAXone (ROCEPHIN) injection 500 mg (has no administration in time range)    Initial Impression / Assessment and Plan / UC Course  I have reviewed the triage vital signs and the nursing notes.  Pertinent labs & imaging results that were available during my care of the patient were reviewed by me and considered in  my medical decision making (see chart for details).     This patient is a very pleasant 36 y.o. year old female presenting with vaginal discharge following exposure to chlamydia and pain surrounding nipple piercing. Afebrile, borderline tachy.  Nipple piercing is not obviously infected, but will cover for this today. Rec changing the piercing to gold or silver.   U-preg negative.  Will send self-swab for G/C, trich, yeast, BV testing. Declines HIV, RPR. Safe sex precautions.   IM rocephin administered.  Doxycycline sent.   ED return precautions discussed. Patient verbalizes understanding and agreement.    Final Clinical Impressions(s) / UC Diagnoses   Final diagnoses:  Infected pierced nipple  Exposure to chlamydia  Vaginitis and vulvovaginitis     Discharge Instructions      -Doxycycline twice daily for 7 days. This will treat for both chlamydia and skin infection. Make sure to wear sunscreen while spending time outside while on this medication as it can increase your chance of sunburn. You can take this medication with food if you have a sensitive stomach. -Continue using saline rinses and salt water to cleanse the piercing. Avoid hydrogen peroxide or alcohol. Consider changing the piercing to a gold or silver metal after about a week. Follow-up if this is getting worse instead of better . -We have sent testing for sexually transmitted infections. We will notify you of any positive results once they are received. If required, we will prescribe any medications you might need. Please refrain from all sexual activity until treatment is complete.  -Seek additional medical attention if you develop fevers/chills, new/worsening abdominal pain, new/worsening vaginal discomfort/discharge, etc.       ED Prescriptions     Medication Sig Dispense Auth. Provider   doxycycline (VIBRAMYCIN) 100 MG capsule Take 1 capsule (100 mg total) by mouth 2 (two) times daily for 7 days. 14 capsule  Hazel Sams, PA-C      PDMP not reviewed this encounter.   Hazel Sams, PA-C 01/21/22 1253

## 2022-01-21 NOTE — ED Triage Notes (Signed)
Pt states her partner called her today to get STD checked. C/o vaginal d/c with slight odor x3 days. Pt c/o pain to lt nipple with redness around piercing.

## 2022-01-22 ENCOUNTER — Telehealth (HOSPITAL_COMMUNITY): Payer: Self-pay | Admitting: Emergency Medicine

## 2022-01-22 LAB — CERVICOVAGINAL ANCILLARY ONLY
Bacterial Vaginitis (gardnerella): POSITIVE — AB
Candida Glabrata: NEGATIVE
Candida Vaginitis: NEGATIVE
Chlamydia: POSITIVE — AB
Comment: NEGATIVE
Comment: NEGATIVE
Comment: NEGATIVE
Comment: NEGATIVE
Comment: NEGATIVE
Comment: NORMAL
Neisseria Gonorrhea: NEGATIVE
Trichomonas: NEGATIVE

## 2022-01-22 MED ORDER — METRONIDAZOLE 500 MG PO TABS: 500.0000 mg | ORAL_TABLET | Freq: Two times a day (BID) | ORAL | 0 refills | Status: DC

## 2022-03-13 ENCOUNTER — Other Ambulatory Visit: Payer: Self-pay

## 2022-03-13 ENCOUNTER — Emergency Department (HOSPITAL_COMMUNITY)
Admission: EM | Admit: 2022-03-13 | Discharge: 2022-03-14 | Disposition: A | Discharge status: Home / Self Care | Payer: 59 | Attending: Emergency Medicine | Admitting: Emergency Medicine

## 2022-03-13 ENCOUNTER — Encounter (HOSPITAL_COMMUNITY): Payer: Self-pay | Admitting: Emergency Medicine

## 2022-03-13 DIAGNOSIS — M25512 Pain in left shoulder: Secondary | ICD-10-CM | POA: Diagnosis not present

## 2022-03-13 DIAGNOSIS — M546 Pain in thoracic spine: Secondary | ICD-10-CM | POA: Insufficient documentation

## 2022-03-13 DIAGNOSIS — R079 Chest pain, unspecified: Secondary | ICD-10-CM | POA: Diagnosis not present

## 2022-03-13 DIAGNOSIS — M549 Dorsalgia, unspecified: Secondary | ICD-10-CM

## 2022-03-13 NOTE — ED Triage Notes (Signed)
Pt c/o chest pain that started tonight. Pt states that starting a week ago she was having left sided neck and shoulder pain. ?

## 2022-03-13 NOTE — ED Provider Triage Note (Signed)
Emergency Medicine Provider Triage Evaluation Note ? ?Bianca Montgomery , a 36 y.o. female  was evaluated in triage.  Pt complains of chest pain.  Started while she was at work today.  States pain in chest symptoms resolved but now feels it in neck and left arm.  She denies any known cardiac history. ? ?Review of Systems  ?Positive: Chest pain ?Negative: fever ? ?Physical Exam  ?BP 134/74 (BP Location: Right Arm)   Pulse 95   Temp 99.3 ?F (37.4 ?C) (Oral)   Resp 16   Ht 5\' 8"  (1.727 m)   Wt 63.5 kg   LMP 02/05/2022   SpO2 100%   BMI 21.29 kg/m?  ?Gen:   Awake, no distress   ?Resp:  Normal effort  ?MSK:   Moves extremities without difficulty  ?Other:  ? ?Medical Decision Making  ?Medically screening exam initiated at 11:48 PM.  Appropriate orders placed.  Ottis Sarnowski was informed that the remainder of the evaluation will be completed by another provider, this initial triage assessment does not replace that evaluation, and the importance of remaining in the ED until their evaluation is complete. ? ?Chest pain, left arm pain.  EKG, labs, CXR. ?  ?Kara Pacer, PA-C ?03/13/22 2353 ? ?

## 2022-03-14 ENCOUNTER — Emergency Department (HOSPITAL_COMMUNITY): Payer: 59

## 2022-03-14 LAB — CBC WITH DIFFERENTIAL/PLATELET
Abs Immature Granulocytes: 0.02 10*3/uL (ref 0.00–0.07)
Basophils Absolute: 0.1 10*3/uL (ref 0.0–0.1)
Basophils Relative: 1 %
Eosinophils Absolute: 0.5 10*3/uL (ref 0.0–0.5)
Eosinophils Relative: 5 %
HCT: 40.4 % (ref 36.0–46.0)
Hemoglobin: 12.7 g/dL (ref 12.0–15.0)
Immature Granulocytes: 0 %
Lymphocytes Relative: 23 %
Lymphs Abs: 2.4 10*3/uL (ref 0.7–4.0)
MCH: 29.4 pg (ref 26.0–34.0)
MCHC: 31.4 g/dL (ref 30.0–36.0)
MCV: 93.5 fL (ref 80.0–100.0)
Monocytes Absolute: 0.7 10*3/uL (ref 0.1–1.0)
Monocytes Relative: 7 %
Neutro Abs: 6.6 10*3/uL (ref 1.7–7.7)
Neutrophils Relative %: 64 %
Platelets: 268 10*3/uL (ref 150–400)
RBC: 4.32 MIL/uL (ref 3.87–5.11)
RDW: 11.5 % (ref 11.5–15.5)
WBC: 10.3 10*3/uL (ref 4.0–10.5)
nRBC: 0 % (ref 0.0–0.2)

## 2022-03-14 LAB — BASIC METABOLIC PANEL
Anion gap: 8 (ref 5–15)
BUN: 10 mg/dL (ref 6–20)
CO2: 24 mmol/L (ref 22–32)
Calcium: 9.5 mg/dL (ref 8.9–10.3)
Chloride: 108 mmol/L (ref 98–111)
Creatinine, Ser: 0.71 mg/dL (ref 0.44–1.00)
GFR, Estimated: >60 mL/min (ref >60)
Glucose, Bld: 85 mg/dL (ref 70–99)
Potassium: 3.8 mmol/L (ref 3.5–5.1)
Sodium: 140 mmol/L (ref 135–145)

## 2022-03-14 LAB — TROPONIN I (HIGH SENSITIVITY)
Troponin I (High Sensitivity): 3 ng/L (ref <18)
Troponin I (High Sensitivity): 3 ng/L (ref <18)

## 2022-03-14 LAB — I-STAT BETA HCG BLOOD, ED (MC, WL, AP ONLY): I-stat hCG, quantitative: <5 m[IU]/mL (ref <5)

## 2022-03-14 MED ORDER — PREDNISONE 20 MG PO TABS
40.0000 mg | ORAL_TABLET | Freq: Once | ORAL | Status: AC
  Administered 2022-03-14: 40 mg via ORAL
  Filled 2022-03-14: qty 2

## 2022-03-14 MED ORDER — PREDNISONE 10 MG PO TABS: 40.0000 mg | ORAL_TABLET | Freq: Every day | ORAL | 0 refills | Status: DC

## 2022-03-14 NOTE — ED Provider Notes (Signed)
?[facility] ?Provider Note ? ? ?CSN: [id number] ?Arrival date & time: 03/13/22  2327 ? ?  ? ?History ? ?Chief Complaint  ?Patient presents with  ? Chest Pain  ? ? ?Bianca Montgomery is a 36 y.o. female. ? ?The history is provided by the patient.  ?Chest Pain ?Bianca Montgomery is a 36 y.o. female who presents to the Emergency Department complaining of back pain.  She presents to the emergency department for evaluation of 1 week of left upper back pain.  Pain is located just behind the left shoulder.  It radiates down her left arm.  Today she had an episode where it went to her chest.  Pain is worse with movement.  No numbness, weakness, difficulty breathing, nausea, abdominal pain.  No known injuries.  She is right-hand dominant.  She works in Building [employer].  She has tried OTC analgesics without significant improvement in symptoms. ?  ? ?Home Medications ?Prior to Admission medications   ?Medication Sig Start Date End Date Taking? Authorizing Provider  ?predniSONE (DELTASONE) 10 MG tablet Take 4 tablets (40 mg total) by mouth daily. 03/14/22  Yes Quintella Reichert, MD  ?metroNIDAZOLE (FLAGYL) 500 MG tablet Take 1 tablet (500 mg total) by mouth 2 (two) times daily. 01/22/22   Lamptey, Myrene Galas, MD  ?   ? ?Allergies    ?Shellfish allergy   ? ?Review of Systems   ?Review of Systems  ?Cardiovascular:  Positive for chest pain.  ?All other systems reviewed and are negative. ? ?Physical Exam ?Updated Vital Signs ?BP 97/68 (BP Location: Right Arm)   Pulse 82   Temp 98.5 ?F (36.9 ?C) (Oral)   Resp 17   Ht 5\' 8"  (1.727 m)   Wt 63.5 kg   LMP 02/05/2022   SpO2 100%   BMI 21.29 kg/m?  ?Physical Exam ?Vitals and nursing note reviewed.  ?Constitutional:   ?   Appearance: She is well-developed.  ?HENT:  ?   Head: Normocephalic and atraumatic.  ?Cardiovascular:  ?   Rate and Rhythm: Normal rate and regular rhythm.  ?   Heart sounds: No murmur heard. ?Pulmonary:  ?   Effort: Pulmonary effort is  normal. No respiratory distress.  ?   Breath sounds: Normal breath sounds.  ?Abdominal:  ?   Palpations: Abdomen is soft.  ?   Tenderness: There is no abdominal tenderness. There is no guarding or rebound.  ?Musculoskeletal:  ?   Cervical back: Neck supple.  ?   Comments: 2+ radial pulses bilaterally.  There is soft tissue tenderness over the left upper back just superior to the scapula.  No discrete lateral or anterior shoulder tenderness.  Range of motion is intact throughout the left upper extremity and shoulder.  5 out of 5 grip strength in bilateral upper extremities  ?Skin: ?   General: Skin is warm and dry.  ?Neurological:  ?   Mental Status: She is alert and oriented to person, place, and time.  ?Psychiatric:     ?   Behavior: Behavior normal.  ? ? ?ED Results / Procedures / Treatments   ?Labs ?(all labs ordered are listed, but only abnormal results are displayed) ?Labs Reviewed  ?CBC WITH DIFFERENTIAL/PLATELET  ?BASIC METABOLIC PANEL  ?I-STAT BETA HCG BLOOD, ED ([facility], [facility], [facility] ONLY)  ?TROPONIN I (HIGH SENSITIVITY)  ?TROPONIN I (HIGH SENSITIVITY)  ? ? ?EKG ?EKG Interpretation ? ?Date/Time:  Thursday March 13 2022 23:44:43 EDT ?Ventricular Rate:  88 ?PR Interval:  106 ?QRS  Duration: 78 ?QT Interval:  322 ?QTC Calculation: 389 ?R Axis:   45 ?Text Interpretation: Sinus rhythm with short PR Nonspecific T wave abnormality Abnormal ECG No significant change since last tracing Confirmed by Quintella Reichert [phone]) on 03/14/2022 3:37:42 AM ? ?Radiology ?DG Chest 2 View ? ?Result Date: 03/14/2022 ?CLINICAL DATA:  Chest pain EXAM: CHEST - 2 VIEW COMPARISON:  None. FINDINGS: The heart size and mediastinal contours are within normal limits. Both lungs are clear. The visualized skeletal structures are unremarkable. IMPRESSION: No active cardiopulmonary disease. Electronically Signed   By: Fidela Salisbury M.D.   On: 03/14/2022 00:15   ? ?Procedures ?Procedures  ? ? ?Medications Ordered in ED ?Medications  ?predniSONE (DELTASONE)  tablet 40 mg (has no administration in time range)  ? ? ?ED Course/ Medical Decision Making/ A&P ?  ?                        ?Medical Decision Making ?Risk ?Prescription drug management. ? ? ?Patient here for evaluation of left upper back and chest pain.  EKG is unchanged compared to priors.  Troponin is negative.  Pain is reproducible on examination and she is well perfused peripherally.  Current clinical picture is not consistent with ACS, PE, dissection, septic arthritis.  Discussed with patient home care for acute back pain, likely muscle spasm.  Will treat with steroid burst.  Discussed ongoing care with OTC analgesics.  Discussed outpatient follow-up and return precautions. ? ? ? ? ? ? ? ?Final Clinical Impression(s) / ED Diagnoses ?Final diagnoses:  ?Upper back pain on left side  ? ? ?Rx / DC Orders ?ED Discharge Orders   ? ?      Ordered  ?  predniSONE (DELTASONE) 10 MG tablet  Daily       ? 03/14/22 0409  ? ?  ?  ? ?  ? ? ?  ?Quintella Reichert, MD ?03/14/22 [phone] ? ?

## 2022-10-17 ENCOUNTER — Encounter (HOSPITAL_COMMUNITY): Payer: Self-pay | Admitting: Emergency Medicine

## 2022-10-17 ENCOUNTER — Ambulatory Visit (HOSPITAL_COMMUNITY)
Admission: EM | Admit: 2022-10-17 | Discharge: 2022-10-17 | Disposition: A | Discharge status: Home / Self Care | Payer: 59 | Attending: Physician Assistant | Admitting: Physician Assistant

## 2022-10-17 DIAGNOSIS — N898 Other specified noninflammatory disorders of vagina: Secondary | ICD-10-CM

## 2022-10-17 LAB — POC URINE PREG, ED: Preg Test, Ur: NEGATIVE

## 2022-10-17 NOTE — ED Triage Notes (Signed)
C/o vaginal discharge and foul odor for couple weeks. Denies pain, rash.

## 2022-10-17 NOTE — ED Provider Notes (Signed)
[facility]    CSN: [id number] Arrival date & time: 10/17/22  1246      History   Chief Complaint Chief Complaint  Patient presents with   Vaginal Discharge    HPI Bianca Montgomery is a 36 y.o. female.   Patient here today for evaluation of vaginal discharge with foul odor that she has noticed for a couple weeks.  She has not had any vaginal pain or any genital rashes.  She is unsure of STD exposure but does state that she was recently with her ex-boyfriend who notes that his recent partner stated that she did have "something".  She does not report any back pain, abdominal pain, nausea, vomiting or fever.  The history is provided by the patient.    History reviewed. No pertinent past medical history.  There are no problems to display for this patient.   Past Surgical History:  Procedure Laterality Date   TUBAL LIGATION  03/26/2012    OB History   No obstetric history on file.      Home Medications    Prior to Admission medications   Medication Sig Start Date End Date Taking? Authorizing Provider  metroNIDAZOLE (FLAGYL) 500 MG tablet Take 1 tablet (500 mg total) by mouth 2 (two) times daily. 01/22/22   Lamptey, Britta Mccreedy, MD  predniSONE (DELTASONE) 10 MG tablet Take 4 tablets (40 mg total) by mouth daily. 03/14/22   Tilden Fossa, MD    Family History Family History  Problem Relation Age of Onset   Healthy Mother    Healthy Father     Social History Social History   Tobacco Use   Smoking status: Every Day    Packs/day: 0.10    Types: Cigarettes   Smokeless tobacco: Never  Vaping Use   Vaping Use: Never used  Substance Use Topics   Alcohol use: No   Drug use: No     Allergies   Shellfish allergy   Review of Systems Review of Systems  Constitutional:  Negative for chills and fever.  Eyes:  Negative for discharge and redness.  Gastrointestinal:  Negative for abdominal pain, nausea and vomiting.  Genitourinary:  Positive for vaginal  discharge. Negative for genital sores, pelvic pain, vaginal bleeding and vaginal pain.  Musculoskeletal:  Negative for back pain.     Physical Exam Triage Vital Signs ED Triage Vitals  Enc Vitals Group     BP      Pulse      Resp      Temp      Temp src      SpO2      Weight      Height      Head Circumference      Peak Flow      Pain Score      Pain Loc      Pain Edu?      Excl. in GC?    No data found.  Updated Vital Signs BP 112/76 (BP Location: Left Arm)   Pulse 93   Temp 99 F (37.2 C) (Oral)   Resp 16   LMP 09/29/2022   SpO2 99%      Physical Exam Vitals and nursing note reviewed.  Constitutional:      General: She is not in acute distress.    Appearance: Normal appearance. She is not ill-appearing.  HENT:     Head: Normocephalic and atraumatic.  Eyes:     Conjunctiva/sclera: Conjunctivae normal.  Cardiovascular:  Rate and Rhythm: Normal rate.  Pulmonary:     Effort: Pulmonary effort is normal.  Neurological:     Mental Status: She is alert.  Psychiatric:        Mood and Affect: Mood normal.        Behavior: Behavior normal.        Thought Content: Thought content normal.      UC Treatments / Results  Labs (all labs ordered are listed, but only abnormal results are displayed) Labs Reviewed  POC URINE PREG, ED  CERVICOVAGINAL ANCILLARY ONLY    EKG   Radiology No results found.  Procedures Procedures (including critical care time)  Medications Ordered in UC Medications - No data to display  Initial Impression / Assessment and Plan / UC Course  I have reviewed the triage vital signs and the nursing notes.  Pertinent labs & imaging results that were available during my care of the patient were reviewed by me and considered in my medical decision making (see chart for details).    STD screening ordered.  Will await results further recommendation.  Final Clinical Impressions(s) / UC Diagnoses   Final diagnoses:  Vaginal  discharge   Discharge Instructions   None    ED Prescriptions   None    PDMP not reviewed this encounter.   Francene Finders, PA-C 10/17/22 1432

## 2022-10-20 ENCOUNTER — Telehealth: Payer: Self-pay | Admitting: Emergency Medicine

## 2022-10-20 LAB — CERVICOVAGINAL ANCILLARY ONLY
Bacterial Vaginitis (gardnerella): POSITIVE — AB
Candida Glabrata: NEGATIVE
Candida Vaginitis: NEGATIVE
Chlamydia: NEGATIVE
Comment: NEGATIVE
Comment: NEGATIVE
Comment: NEGATIVE
Comment: NEGATIVE
Comment: NEGATIVE
Comment: NORMAL
Neisseria Gonorrhea: NEGATIVE
Trichomonas: NEGATIVE

## 2022-10-20 MED ORDER — METRONIDAZOLE 500 MG PO TABS: 500.0000 mg | ORAL_TABLET | Freq: Two times a day (BID) | ORAL | 0 refills | Status: DC

## 2022-11-11 ENCOUNTER — Telehealth: Payer: 59 | Admitting: Physician Assistant

## 2022-11-11 DIAGNOSIS — N760 Acute vaginitis: Secondary | ICD-10-CM

## 2022-11-11 NOTE — Progress Notes (Signed)
Because of recurrent/continued symptoms despite treatment, I feel your condition warrants further evaluation and I recommend that you be seen in a face to face visit. You could possibly have residual BV, an antibiotic-induced yeast infection, or both. As such we recommend repeat testing so we can make sure the proper treatment is given.    NOTE: There will be NO CHARGE for this eVisit   If you are having a true medical emergency please call 911.      For an urgent face to face visit, [facility] has seven urgent care centers for your convenience:     [facility] at [facility] Directions [phone] [address] [place], [place] [address]    [facility]) Get Driving Directions [phone] [address] [place], [place] [address]  [facility] [place]) Get Driving Directions [phone] [address] [place],  [place]  [address]  [facility] - at [employer] Directions  [phone] [address] [place],  [place] [address]   [facility] at [facility] Get Driving Directions [phone] [address], [address] [place], [place] [address]   [facility] at [facility] Get Driving Directions  [phone] [address] [place], [place] [address]   [facility] at [facility] Directions [phone] [address] [place], [place] [address]  Your MyChart E-visit questionnaire answers were reviewed by a board certified advanced clinical practitioner to complete your personal care plan based on your specific symptoms.  Thank you for using e-Visits.

## 2023-01-13 IMAGING — CR DG CHEST 2V
2 series · 2 of 2 positions shown · non-contrast
Comparison: None.

CLINICAL DATA: Chest pain

EXAM:
CHEST - 2 VIEW

[chest pa]
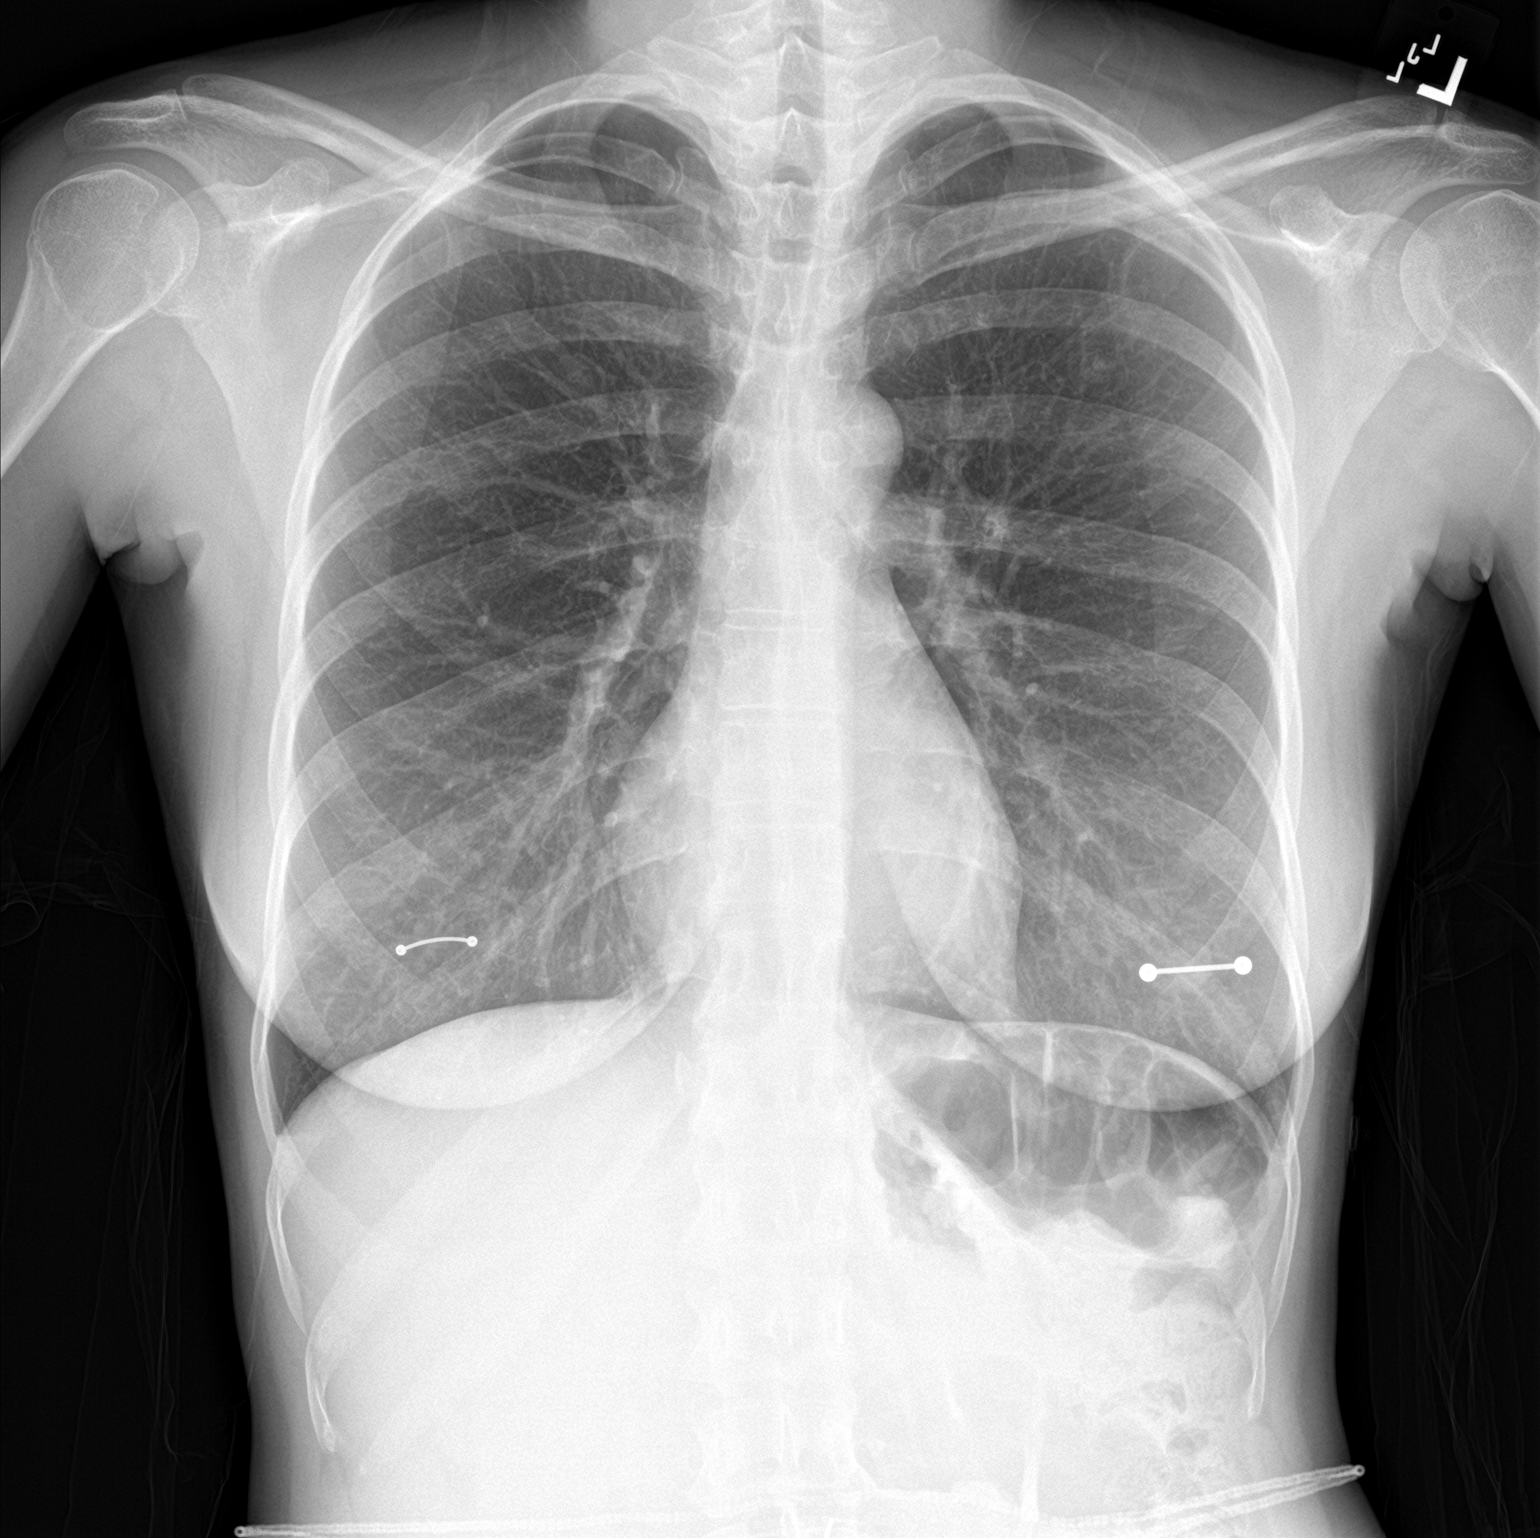

[chest lat]
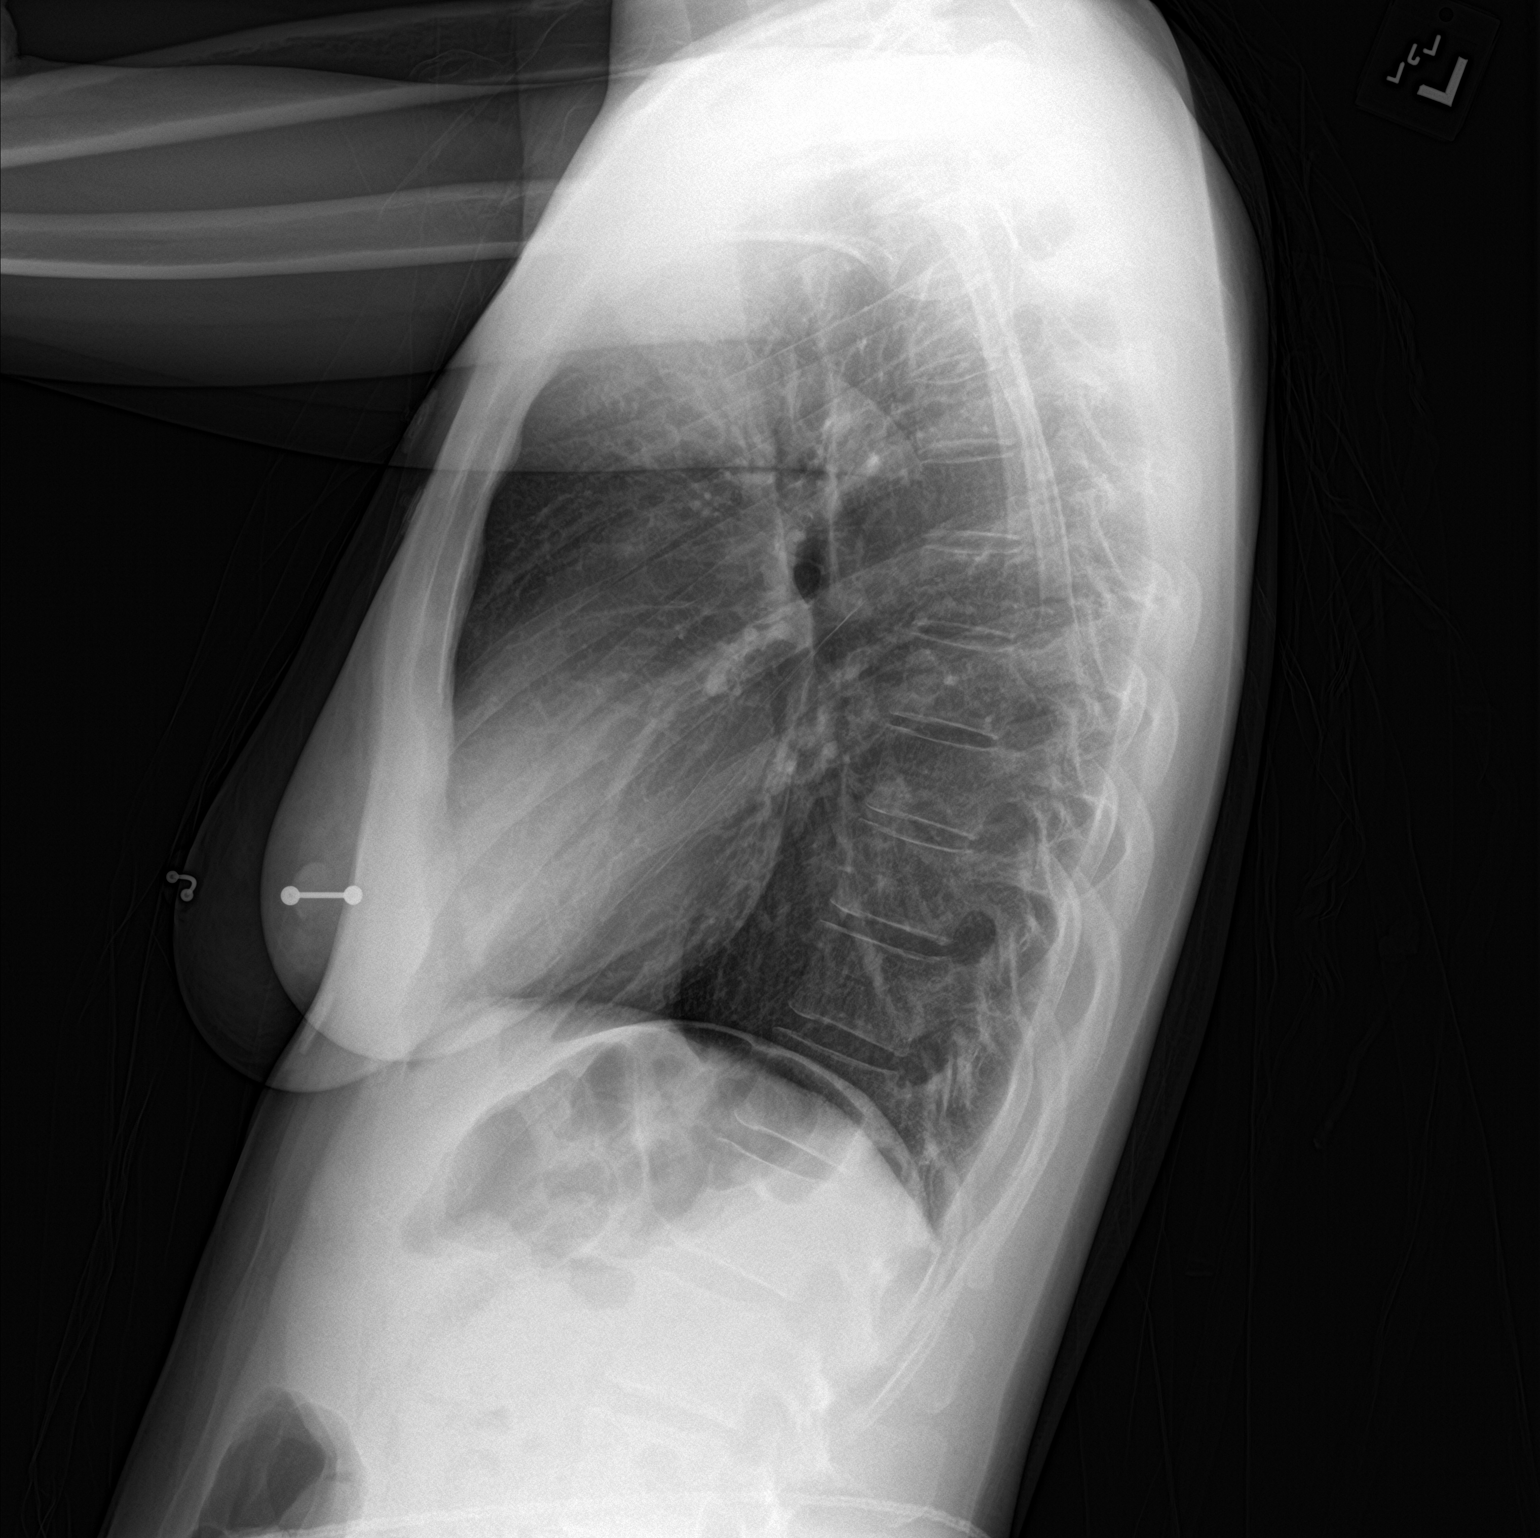

[2 of 2 positions shown; findings below may reference images not displayed]

FINDINGS: The heart size and mediastinal contours are within normal limits.
Both lungs are clear. The visualized skeletal structures are
unremarkable.
IMPRESSION: No active cardiopulmonary disease.

## 2023-08-07 ENCOUNTER — Encounter: Payer: 59 | Admitting: Family

## 2023-08-07 NOTE — Progress Notes (Signed)
Erroneous encounter-disregard

## 2023-09-30 ENCOUNTER — Telehealth: Payer: 59 | Admitting: Physician Assistant

## 2023-09-30 DIAGNOSIS — N760 Acute vaginitis: Secondary | ICD-10-CM

## 2023-09-30 DIAGNOSIS — B9689 Other specified bacterial agents as the cause of diseases classified elsewhere: Secondary | ICD-10-CM | POA: Diagnosis not present

## 2023-10-01 MED ORDER — METRONIDAZOLE 500 MG PO TABS: 500.0000 mg | ORAL_TABLET | Freq: Two times a day (BID) | ORAL | 0 refills | Status: DC

## 2023-10-01 NOTE — Progress Notes (Signed)
I have spent 5 minutes in review of e-visit questionnaire, review and updating patient chart, medical decision making and response to patient.   Mia Milan Cody Jacklynn Dehaas, PA-C    

## 2023-10-01 NOTE — Progress Notes (Signed)

## 2023-10-28 ENCOUNTER — Telehealth: Payer: 59 | Admitting: Physician Assistant

## 2023-10-28 DIAGNOSIS — N760 Acute vaginitis: Secondary | ICD-10-CM

## 2023-10-28 DIAGNOSIS — M545 Low back pain, unspecified: Secondary | ICD-10-CM

## 2023-10-29 NOTE — Progress Notes (Signed)
Because of reoccurring vaginitis and with associated back pain that is not common with typical yeast infection, I feel your condition warrants further evaluation and I recommend that you be seen in a face to face visit.   NOTE: There will be NO CHARGE for this eVisit   If you are having a true medical emergency please call 911.      For an urgent face to face visit, [facility] has eight urgent care centers for your convenience:   NEW!! [facility] Center at [facility] Get Driving Directions [phone] [address], [address] [place], [address]    [facility] Health Urgent Care Center at [facility] Get Driving Directions [phone] [address] [place], [place] [address]   [facility] Health Urgent Care Center [facility]) Get Driving Directions [phone] [address] [place], [place] [address]  [facility] Urgent Care Center [facility]) Get Driving Directions [phone] [address] [place],  [place]  [address]  [facility] Health Urgent Care Center [facility] - at [employer]  [phone] [address] [place],  [place] [address]   [facility] Health Urgent Care at [facility] Get Driving Directions [phone] [address] [employer] [address] [place], [place] [address]   [facility] Health Urgent Care at [facility] Get Driving Directions  [phone] [address] [place], [place] [address]   [facility] Health Urgent Care at [facility] Directions [phone] [address] [place], [place] [address]  Your MyChart E-visit questionnaire answers were reviewed by a board certified advanced clinical practitioner to complete your personal care plan based on your specific symptoms.  Thank you for using e-Visits.

## 2023-11-22 ENCOUNTER — Telehealth: Payer: 59 | Admitting: Family Medicine

## 2023-11-22 DIAGNOSIS — B3731 Acute candidiasis of vulva and vagina: Secondary | ICD-10-CM | POA: Diagnosis not present

## 2023-11-22 MED ORDER — FLUCONAZOLE 150 MG PO TABS: 150.0000 mg | ORAL_TABLET | Freq: Once | ORAL | 0 refills | Status: AC

## 2023-11-22 NOTE — Progress Notes (Signed)

## 2023-12-19 ENCOUNTER — Encounter: Payer: 59 | Admitting: Nurse Practitioner

## 2023-12-19 DIAGNOSIS — N760 Acute vaginitis: Secondary | ICD-10-CM

## 2023-12-19 NOTE — Progress Notes (Signed)
 I have spent 5 minutes in review of e-visit questionnaire, review and updating patient chart, medical decision making and response to patient.   Claiborne Rigg, NP

## 2023-12-19 NOTE — Progress Notes (Signed)
 Because belly pain is not associated with yeast vaginitis, I feel your condition warrants further evaluation and I recommend that you be seen for a face to face visit.  Please contact your primary care physician practice or the local health department to be seen for a vaginal swab test. If you are sexually active you would want to rule out any STDs as well.  Many offices offer virtual options to be seen via video if you are not comfortable going in person to a medical facility at this time.  NOTE: You will NOT be charged for this eVisit.  If you do not have a PCP, [facility] offers a free physician referral service available at [phone]. Our trained staff has the experience, knowledge and resources to put you in touch with a physician who is right for you.    If you are having a true medical emergency please call 911.   Your e-visit answers were reviewed by a board certified advanced clinical practitioner to complete your personal care plan.  Thank you for using e-Visits.

## 2023-12-20 ENCOUNTER — Telehealth: Payer: 59 | Admitting: Physician Assistant

## 2023-12-20 DIAGNOSIS — N760 Acute vaginitis: Secondary | ICD-10-CM

## 2023-12-20 NOTE — Progress Notes (Signed)
 Because of your recurrent infections, I feel your condition warrants further evaluation and I recommend that you be seen for a face to face visit.  Please contact your primary care physician practice to be seen. Many offices offer virtual options to be seen via video if you are not comfortable going in person to a medical facility at this time.  NOTE: You will NOT be charged for this eVisit.  If you do not have a PCP, [facility] offers a free physician referral service available at [phone]. Our trained staff has the experience, knowledge and resources to put you in touch with a physician who is right for you.    If you are having a true medical emergency please call 911.   Your e-visit answers were reviewed by a board certified advanced clinical practitioner to complete your personal care plan.  Thank you for using e-Visits.

## 2023-12-30 ENCOUNTER — Telehealth: Payer: 59 | Admitting: Physician Assistant

## 2023-12-30 DIAGNOSIS — N939 Abnormal uterine and vaginal bleeding, unspecified: Secondary | ICD-10-CM

## 2023-12-30 NOTE — Progress Notes (Signed)
 Because this is not something we can fully assess via an e-visit,  I feel your condition warrants further evaluation and I recommend that you be seen in a face to face visit with your gynecologist or at one of our [facility] clinics.   NOTE: There will be NO CHARGE for this eVisit   If you are having a true medical emergency please call 911.    *[facility] [employer] [facility]             [address], [place], [place] [address] [phone] (*Take patients with no insurance)  *Center for [employer] at [employer] [address] [place], [place],  [place]  [address] [phone] (*Take patients with no insurance)  [facility] for [employer] at [employer]                                                             [address], [address], [place], [place], [address] [phone]  Center for [employer] at [facility] [address], [place], [place], [address] [phone]  Center for [facility] at [facility] [address], [place], [place], [address] [phone]  Center for [facility] at [facility]                                 [address] [place], [place], [place], [address] [phone]  Center for [facility] at [facility]                                    [address] [place], [place], [address] [phone]  Center for [facility] at [facility] [address], [place], [place], [address]                              [facility] [address], [place], [place], [address] [phone]  Your MyChart E-visit questionnaire answers were reviewed by a board certified advanced clinical practitioner to complete your personal care plan based on your specific symptoms.  Thank you for using e-Visits.

## 2024-01-22 ENCOUNTER — Ambulatory Visit (HOSPITAL_BASED_OUTPATIENT_CLINIC_OR_DEPARTMENT_OTHER): Payer: 59 | Admitting: Family Medicine

## 2024-09-12 ENCOUNTER — Telehealth: Admitting: Physician Assistant

## 2024-09-12 DIAGNOSIS — N760 Acute vaginitis: Secondary | ICD-10-CM

## 2024-09-12 DIAGNOSIS — B9689 Other specified bacterial agents as the cause of diseases classified elsewhere: Secondary | ICD-10-CM

## 2024-09-12 MED ORDER — METRONIDAZOLE 500 MG PO TABS: 500.0000 mg | ORAL_TABLET | Freq: Two times a day (BID) | ORAL | 0 refills | Status: AC

## 2024-09-12 NOTE — Progress Notes (Signed)
 E-Visit for Vaginal Symptoms  We are sorry that you are not feeling well. Here is how we plan to help! Based on what you shared with me it looks like you: May have a vaginosis due to bacteria  Vaginosis is an inflammation of the vagina that can result in discharge, itching and pain. The cause is usually a change in the normal balance of vaginal bacteria or an infection. Vaginosis can also result from reduced estrogen levels after menopause.  The most common causes of vaginosis are:   Bacterial vaginosis which results from an overgrowth of one on several organisms that are normally present in your vagina.   Yeast infections which are caused by a naturally occurring fungus called candida.   Vaginal atrophy (atrophic vaginosis) which results from the thinning of the vagina from reduced estrogen levels after menopause.   Trichomoniasis which is caused by a parasite and is commonly transmitted by sexual intercourse.  Factors that increase your risk of developing vaginosis include: Medications, such as antibiotics and steroids Uncontrolled diabetes Use of hygiene products such as bubble bath, vaginal spray or vaginal deodorant Douching Wearing damp or tight-fitting clothing Using an intrauterine device (IUD) for birth control Hormonal changes, such as those associated with pregnancy, birth control pills or menopause Sexual activity Having a sexually transmitted infection  Your treatment plan is Metronidazole or Flagyl 500mg  twice a day for 7 days.  I have electronically sent this prescription into the pharmacy that you have chosen.  Be sure to take all of the medication as directed. Stop taking any medication if you develop a rash, tongue swelling or shortness of breath. Mothers who are breast feeding should consider pumping and discarding their breast milk while on these antibiotics. However, there is no consensus that infant exposure at these doses would be harmful.  Remember that  medication creams can weaken latex condoms. SABRA   HOME CARE:  Good hygiene may prevent some types of vaginosis from recurring and may relieve some symptoms:  Avoid baths, hot tubs and whirlpool spas. Rinse soap from your outer genital area after a shower, and dry the area well to prevent irritation. Don't use scented or harsh soaps, such as those with deodorant or antibacterial action. Avoid irritants. These include scented tampons and pads. Wipe from front to back after using the toilet. Doing so avoids spreading fecal bacteria to your vagina.  Other things that may help prevent vaginosis include:  Don't douche. Your vagina doesn't require cleansing other than normal bathing. Repetitive douching disrupts the normal organisms that reside in the vagina and can actually increase your risk of vaginal infection. Douching won't clear up a vaginal infection. Use a latex condom. Both female and female latex condoms may help you avoid infections spread by sexual contact. Wear cotton underwear. Also wear pantyhose with a cotton crotch. If you feel comfortable without it, skip wearing underwear to bed. Yeast thrives in [employer] Your symptoms should improve in the next day or two.  GET HELP RIGHT AWAY IF:  You have pain in your lower abdomen ( pelvic area or over your ovaries) You develop nausea or vomiting You develop a fever Your discharge changes or worsens You have persistent pain with intercourse You develop shortness of breath, a rapid pulse, or you faint.  These symptoms could be signs of problems or infections that need to be evaluated by a medical provider now.  MAKE SURE YOU   Understand these instructions. Will watch your condition. Will get help right  away if you are not doing well or get worse.  Thank you for choosing an e-visit.  Your e-visit answers were reviewed by a board certified advanced clinical practitioner to complete your personal care plan. Depending upon the  condition, your plan could have included both over the counter or prescription medications.  Please review your pharmacy choice. Make sure the pharmacy is open so you can pick up prescription now. If there is a problem, you may contact your provider through [employer] and have the prescription routed to another pharmacy.  Your safety is important to us . If you have drug allergies check your prescription carefully.   For the next 24 hours you can use MyChart to ask questions about today's visit, request a non-urgent call back, or ask for a work or school excuse. You will get an email in the next two days asking about your experience. I hope that your e-visit has been valuable and will speed your recovery.  I have spent 5 minutes in review of e-visit questionnaire, review and updating patient chart, medical decision making and response to patient.   Bianca Montgomery Dickinson, PA-C

## 2024-09-25 ENCOUNTER — Telehealth: Payer: Self-pay | Admitting: Family

## 2024-09-25 DIAGNOSIS — J069 Acute upper respiratory infection, unspecified: Secondary | ICD-10-CM

## 2024-09-25 MED ORDER — FLUTICASONE PROPIONATE 50 MCG/ACT NA SUSP: 2.0000 | Freq: Every day | NASAL | 6 refills | Status: AC

## 2024-09-25 MED ORDER — CETIRIZINE HCL 10 MG PO TABS: 10.0000 mg | ORAL_TABLET | Freq: Every day | ORAL | 1 refills | Status: AC

## 2024-09-25 MED ORDER — BENZONATATE 100 MG PO CAPS: 100.0000 mg | ORAL_CAPSULE | Freq: Three times a day (TID) | ORAL | 0 refills | Status: AC | PRN

## 2024-09-25 NOTE — Progress Notes (Signed)
 We are sorry you are not feeling well.  Here is how we plan to help!  Based on what you have shared with me, it looks like you may have a viral upper respiratory infection.  Upper respiratory infections are caused by a large number of viruses; however, rhinovirus is the most common cause.   Symptoms vary from person to person, with common symptoms including sore throat, cough, and fatigue or lack of energy.  A low-grade fever of up to 100.4 may present, but is often uncommon.  Symptoms vary however, and are closely related to a person's age or underlying illnesses.  The most common symptoms associated with an upper respiratory infection are nasal discharge or congestion, cough, sneezing, headache and pressure in the ears and face.  These symptoms usually persist for about 3 to 10 days, but can last up to 2 weeks.  It is important to know that upper respiratory infections do not cause serious illness or complications in most cases.    Upper respiratory infections can be transmitted from person to person, with the most common method of transmission being a person's hands.  The virus is able to live on the skin and can infect other persons for up to 2 hours after direct contact.  Also, these can be transmitted when someone coughs or sneezes; thus, it is important to cover the mouth to reduce this risk.  To keep the spread of the illness at bay, good hand hygiene is very important.  This is an infection that is most likely caused by a virus. There are no specific treatments other than to help you with the symptoms until the infection runs its course.  We are sorry you are not feeling well.  Here is how we plan to help!   For nasal congestion, you may use an oral decongestants such as Mucinex  D or if you have glaucoma or high blood pressure use plain Mucinex .  Saline nasal spray or nasal drops can help and can safely be used as often as needed for congestion.  For your congestion, I have prescribed Fluticasone   nasal spray one spray in each nostril twice a day and zyrtec 10 mg.   If you do not have a history of heart disease, hypertension, diabetes or thyroid  disease, prostate/bladder issues or glaucoma, you may also use Sudafed to treat nasal congestion.  It is highly recommended that you consult with a pharmacist or your primary care physician to ensure this medication is safe for you to take.     If you have a cough, you may use cough suppressants such as Delsym and Robitussin.  If you have glaucoma or high blood pressure, you can also use Coricidin HBP.   For cough I have prescribed for you A prescription cough medication called Tessalon  Perles 100 mg. You may take 1-2 capsules every 8 hours as needed for cough  If you have a sore or scratchy throat, use a saltwater gargle-  to  teaspoon of salt dissolved in a 4-ounce to 8-ounce glass of warm water .  Gargle the solution for approximately 15-30 seconds and then spit.  It is important not to swallow the solution.  You can also use throat lozenges/cough drops and Chloraseptic spray to help with throat pain or discomfort.  Warm or cold liquids can also be helpful in relieving throat pain.  For headache, pain or general discomfort, you can use Ibuprofen or Tylenol as directed.   Some authorities believe that zinc sprays or the use  of Echinacea may shorten the course of your symptoms.   HOME CARE Only take medications as instructed by your medical team. Be sure to drink plenty of fluids. Water  is fine as well as fruit juices, sodas and electrolyte beverages. You may want to stay away from caffeine or alcohol. If you are nauseated, try taking small sips of liquids. How do you know if you are getting enough fluid? Your urine should be a pale yellow or almost colorless. Get rest. Taking a steamy shower or using a humidifier may help nasal congestion and ease sore throat pain. You can place a towel over your head and breathe in the steam from hot water  coming  from a faucet. Using a saline nasal spray works much the same way. Cough drops, hard candies and sore throat lozenges may ease your cough. Avoid close contacts especially the very young and the elderly Cover your mouth if you cough or sneeze Always remember to wash your hands.   GET HELP RIGHT AWAY IF: You develop worsening fever. If your symptoms do not improve within 10 days You develop yellow or green discharge from your nose over 3 days. You have coughing fits You develop a severe head ache or visual changes. You develop shortness of breath, difficulty breathing or start having chest pain Your symptoms persist after you have completed your treatment plan  MAKE SURE YOU  Understand these instructions. Will watch your condition. Will get help right away if you are not doing well or get worse.  Your e-visit answers were reviewed by a board certified advanced clinical practitioner to complete your personal care plan. Depending upon the condition, your plan could have included both over the counter or prescription medications. Please review your pharmacy choice. If there is a problem, you may call our nursing hot line at and have the prescription routed to another pharmacy. Your safety is important to us . If you have drug allergies check your prescription carefully.   You can use MyChart to ask questions about today's visit, request a non-urgent call back, or ask for a work or school excuse for 24 hours related to this e-Visit. If it has been greater than 24 hours you will need to follow up with your provider, or enter a new e-Visit to address those concerns. You will get an e-mail in the next two days asking about your experience.  I hope that your e-visit has been valuable and will speed your recovery. Thank you for using e-visits.   I have spent 5 minutes in review of e-visit questionnaire, review and updating patient chart, medical decision making and response to patient.   Bari Learn, FNP

## 2024-10-26 ENCOUNTER — Ambulatory Visit: Payer: Self-pay | Admitting: Family Medicine

## 2024-10-26 ENCOUNTER — Other Ambulatory Visit: Payer: Self-pay

## 2024-10-26 ENCOUNTER — Encounter: Payer: Self-pay | Admitting: Family Medicine

## 2024-10-26 VITALS — BP 118/72 | Ht 68.0 in | Wt 175.0 lb

## 2024-10-26 DIAGNOSIS — M25512 Pain in left shoulder: Secondary | ICD-10-CM

## 2024-10-26 DIAGNOSIS — M5412 Radiculopathy, cervical region: Secondary | ICD-10-CM

## 2024-10-26 DIAGNOSIS — G8929 Other chronic pain: Secondary | ICD-10-CM | POA: Diagnosis not present

## 2024-10-26 MED ORDER — MELOXICAM 15 MG PO TABS: 15.0000 mg | ORAL_TABLET | Freq: Every day | ORAL | 1 refills | Status: DC

## 2024-10-26 NOTE — Progress Notes (Addendum)
 PCP: Bianca Montgomery, No Pcp Per  Bianca Montgomery is a 38 y.o. female here for left-sided neck and shoulder pain.  Pain has been present for the last 1-2 years.  She states that she has been previously been told that her pain was due to a pinched nerve in her neck.  She points to the superior and lateral aspects of the left shoulder when asked where her pain is located.  Pain is intermittent, worse with activity in general.  There are no specific movements that exacerbate her pain.  Pain seems to be alleviated by laying on her left side and with holding her arm over her head.  She has tried Tylenol , NSAIDs, and BC powders. She describes radiation of pain distally into the left forearm and hand.  She also describes paresthesias encompassing the entire left hand.  Denies weakness in the left upper extremity.  No prior history of injury to the left shoulder.  No past medical history on file.  Current Outpatient Medications on File Prior to Visit  Medication Sig Dispense Refill   benzonatate (TESSALON PERLES) 100 MG capsule Take 1 capsule (100 mg total) by mouth 3 (three) times daily as needed. 20 capsule 0   cetirizine (ZYRTEC ALLERGY) 10 MG tablet Take 1 tablet (10 mg total) by mouth daily. 90 tablet 1   fluticasone  (FLONASE ) 50 MCG/ACT nasal spray Place 2 sprays into both nostrils daily. 16 g 6   No current facility-administered medications on file prior to visit.    Past Surgical History:  Procedure Laterality Date   TUBAL LIGATION  03/26/2012    Allergies  Allergen Reactions   Shellfish Allergy     BP 118/72   Ht 5' 8 (1.727 m)   Wt 175 lb (79.4 kg)   BMI 26.61 kg/m       No data to display              No data to display              Objective:  Physical Exam:  Gen: NAD, comfortable in exam room  Neck No gross deformity, swelling, bruising. Focal TTP over the left trapezius.  No midline/bony TTP. FROM. BUE strength 5/5.   Sensation intact to light touch.   2+ equal  reflexes in triceps, biceps, brachioradialis tendons. Negative spurlings. NV intact distal BUEs.   Left shoulder No swelling, ecchymoses.  No gross deformity. No TTP. FROM. Negative Hawkins and Neers. Negative Yergasons. Strength 5/5 with empty can and resisted internal/external rotation. Discomfort with empty can and resisted external rotation Negative apprehension. NV intact distally.   Limited MSK US  left shoulder Limited MSK u/s left shoulder: Biceps tendon: Normal appearance Pec major tendon: Normal appearance Subscapularis: Normal appearance Infraspinatus: Normal appearance Supraspinatus: Normal appearance with mild subacromial bursitis Posterior glenohumeral joint: Normal appearance  Impression: Mild subacromial bursitis, otherwise no acute findings  Assessment and Plan:  Left sided neck and shoulder pain Pain has been present x 1-2 years.  She has features on her exam suggestive of cervical pathology vs rotator cuff pathology.  Ultrasound evaluation of the left shoulder showed only mild subacromial bursitis but otherwise no acute findings.  Based on her description of discomfort, her pain is more likely due to cervical radiculopathy.  Treatment options reviewed.  Recommend physical therapy as a mainstay of treatment.  Will also prescribe meloxicam for pain relief.  Will message our office if there is no improvement in pain within 1-2 weeks, at which time we can  prescribe a Medrol  Dosepak.  Follow-up in 6 weeks for reassessment.  Consider MRI of the cervical spine if there is no improvement at that time.

## 2024-10-26 NOTE — Patient Instructions (Signed)
 You have cervical radiculopathy (a pinched nerve in the neck). Meloxicam 15mg  daily with food for pain and inflammation. Don't take aleve  or ibuprofen  while on this. Simple range of motion exercises within limits of pain to prevent further stiffness. Start physical therapy for stretching, exercises, traction, and modalities. Heat 15 minutes at a time 3-4 times a day to help with spasms. Watch head position when on computers, texting, when sleeping in bed - should in line with back to prevent further nerve traction and irritation. If not improving we will consider an MRI. Follow up with me in 5-6 weeks but call sooner if you're struggling and I would send in a prednisone  dose pack.

## 2024-11-07 ENCOUNTER — Encounter (HOSPITAL_COMMUNITY): Payer: Self-pay | Admitting: *Deleted

## 2024-11-07 ENCOUNTER — Ambulatory Visit (HOSPITAL_COMMUNITY)
Admission: EM | Admit: 2024-11-07 | Discharge: 2024-11-07 | Disposition: A | Discharge status: Home / Self Care | Attending: Emergency Medicine | Admitting: Emergency Medicine

## 2024-11-07 DIAGNOSIS — M545 Low back pain, unspecified: Secondary | ICD-10-CM | POA: Diagnosis not present

## 2024-11-07 DIAGNOSIS — S3992XA Unspecified injury of lower back, initial encounter: Secondary | ICD-10-CM | POA: Diagnosis not present

## 2024-11-07 MED ORDER — NAPROXEN 500 MG PO TABS: 500.0000 mg | ORAL_TABLET | Freq: Two times a day (BID) | ORAL | 0 refills | Status: AC

## 2024-11-07 MED ORDER — CYCLOBENZAPRINE HCL 10 MG PO TABS: 10.0000 mg | ORAL_TABLET | Freq: Two times a day (BID) | ORAL | 0 refills | Status: AC | PRN

## 2024-11-07 NOTE — ED Triage Notes (Signed)
 Pt states she fell down the steps in her house yesterday and now she is having low back pain. She has been taking mobic  without relief.

## 2024-11-07 NOTE — Discharge Instructions (Addendum)
 Stop taking the meloxicam /Mobic . Instead start the Naproxen . Twice daily with food.  You can take the muscle relaxer Flexeril  twice daily. If the medication makes you drowsy, take only at bed time. Topical therapies such as heating pad, icyhot, biofreeze, etc Avoid heavy lifting and strenuous activity It may take several days to a week for symptoms to improve.  Please return or follow up with your primary care provider if needed.

## 2024-11-07 NOTE — ED Provider Notes (Signed)
 [facility]    CSN: [id number] Arrival date & time: 11/07/24  1547      History   Chief Complaint Chief Complaint  Patient presents with   Back Pain   Fall    HPI Bianca Montgomery is a 38 y.o. female.  Yesterday fell down 6 carpeted stairs  Her upper and lower back hit the steps. No head injury or LOC This morning she started having low back pain. Worse with certain movement. Had to miss work. Denies radiation of pain. No weakness or paresthesias No bladder/bowel dysfunction  Was prescribed meloxicam  a week ago for chronic shoulder pain, took one yesterday after fall. No meds today  History reviewed. No pertinent past medical history.  There are no active problems to display for this patient.   Past Surgical History:  Procedure Laterality Date   TUBAL LIGATION  03/26/2012    OB History   No obstetric history on file.      Home Medications    Prior to Admission medications   Medication Sig Start Date End Date Taking? Authorizing Provider  cyclobenzaprine  (FLEXERIL ) 10 MG tablet Take 1 tablet (10 mg total) by mouth 2 (two) times daily as needed for muscle spasms. 11/07/24  Yes Monie Shere, Asberry, PA-C  naproxen  (NAPROSYN ) 500 MG tablet Take 1 tablet (500 mg total) by mouth 2 (two) times daily. 11/07/24  Yes Hasten Sweitzer, Asberry, PA-C  benzonatate  (TESSALON  PERLES) 100 MG capsule Take 1 capsule (100 mg total) by mouth 3 (three) times daily as needed. 09/25/24   Lavell Bari LABOR, FNP  cetirizine  (ZYRTEC  ALLERGY) 10 MG tablet Take 1 tablet (10 mg total) by mouth daily. 09/25/24   Lavell Bari LABOR, FNP  fluticasone  (FLONASE ) 50 MCG/ACT nasal spray Place 2 sprays into both nostrils daily. 09/25/24   Lavell Bari LABOR, FNP    Family History Family History  Problem Relation Age of Onset   Healthy Mother    Healthy Father     Social History Social History   Tobacco Use   Smoking status: Every Day    Current packs/day: 0.10    Types: Cigarettes   Smokeless  tobacco: Never  Vaping Use   Vaping status: Never Used  Substance Use Topics   Alcohol use: No   Drug use: No     Allergies   Shellfish allergy   Review of Systems Review of Systems  Musculoskeletal:  Positive for back pain.   As per HPI  Physical Exam Triage Vital Signs ED Triage Vitals  Encounter Vitals Group     BP 11/07/24 1637 117/76     Girls Systolic BP Percentile --      Girls Diastolic BP Percentile --      Boys Systolic BP Percentile --      Boys Diastolic BP Percentile --      Pulse Rate 11/07/24 1637 86     Resp 11/07/24 1637 18     Temp 11/07/24 1637 97.9 F (36.6 C)     Temp Source 11/07/24 1637 Oral     SpO2 11/07/24 1637 96 %     Weight --      Height --      Head Circumference --      Peak Flow --      Pain Score 11/07/24 1634 10     Pain Loc --      Pain Education --      Exclude from Growth Chart --    No data found.  Updated  Vital Signs BP 117/76 (BP Location: Left Arm)   Pulse 86   Temp 97.9 F (36.6 C) (Oral)   Resp 18   LMP 10/27/2024 (Exact Date)   SpO2 96%    Physical Exam Vitals and nursing note reviewed.  Constitutional:      General: She is not in acute distress. HENT:     Mouth/Throat:     Mouth: Mucous membranes are moist.     Pharynx: Oropharynx is clear.  Eyes:     Extraocular Movements: Extraocular movements intact.     Conjunctiva/sclera: Conjunctivae normal.     Pupils: Pupils are equal, round, and reactive to light.  Cardiovascular:     Rate and Rhythm: Normal rate and regular rhythm.     Heart sounds: Normal heart sounds.  Pulmonary:     Effort: Pulmonary effort is normal.     Breath sounds: Normal breath sounds.  Musculoskeletal:        General: Normal range of motion.     Cervical back: Normal range of motion. No rigidity or tenderness.     Lumbar back: Tenderness present. No deformity or spasms. Normal range of motion. Negative right straight leg raise test and negative left straight leg raise test.        Back:     Comments: Tenderness in area indicated. No palpable deformity or step off.   Skin:    General: Skin is warm and dry.     Comments: No obvious bruising, erythema, rash, other skin changes   Neurological:     General: No focal deficit present.     Mental Status: She is alert and oriented to person, place, and time.     Cranial Nerves: Cranial nerves 2-12 are intact.     Sensory: Sensation is intact.     Motor: Motor function is intact.     Coordination: Coordination is intact.     Gait: Gait is intact.     Comments: Strength 5/5 upper and lower extremities. Sensation equal and intact throughout     UC Treatments / Results  Labs (all labs ordered are listed, but only abnormal results are displayed) Labs Reviewed - No data to display  EKG   Radiology No results found.  Procedures Procedures (including critical care time)  Medications Ordered in UC Medications - No data to display  Initial Impression / Assessment and Plan / UC Course  I have reviewed the triage vital signs and the nursing notes.  Pertinent labs & imaging results that were available during my care of the patient were reviewed by me and considered in my medical decision making (see chart for details).  Offered plain films today, patient reports she will return for imaging if symptoms are not improving over the week Likely muscular pain along with bruise of muscle/bone Neurologically intact. No red flags. Reports mobic  not helpful. Switch to naproxen  500 mg BID. Flexeril  BID with drowsy precautions. Other supportive care. Return and ED precautions. Note for work given She can follow with her sports med specialist if needed  Final Clinical Impressions(s) / UC Diagnoses   Final diagnoses:  Acute bilateral low back pain without sciatica  Injury of back, initial encounter     Discharge Instructions      Stop taking the meloxicam /Mobic . Instead start the Naproxen . Twice daily with food.  You  can take the muscle relaxer Flexeril  twice daily. If the medication makes you drowsy, take only at bed time. Topical therapies such as heating pad, icyhot,  biofreeze, etc Avoid heavy lifting and strenuous activity It may take several days to a week for symptoms to improve.  Please return or follow up with your primary care provider if needed.    ED Prescriptions     Medication Sig Dispense Auth. Provider   naproxen  (NAPROSYN ) 500 MG tablet Take 1 tablet (500 mg total) by mouth 2 (two) times daily. 30 tablet Jove Beyl, PA-C   cyclobenzaprine  (FLEXERIL ) 10 MG tablet Take 1 tablet (10 mg total) by mouth 2 (two) times daily as needed for muscle spasms. 20 tablet Yardley Beltran, Asberry, PA-C      PDMP not reviewed this encounter.   Jeryl Asberry, PA-C 11/07/24 8266

## 2024-11-30 ENCOUNTER — Ambulatory Visit: Admitting: Family Medicine

## 2024-12-01 ENCOUNTER — Telehealth: Admitting: Physician Assistant

## 2024-12-01 DIAGNOSIS — B3731 Acute candidiasis of vulva and vagina: Secondary | ICD-10-CM | POA: Diagnosis not present

## 2024-12-01 MED ORDER — FLUCONAZOLE 150 MG PO TABS: ORAL_TABLET | ORAL | 0 refills | Status: AC

## 2024-12-01 NOTE — Progress Notes (Signed)

## 2024-12-06 ENCOUNTER — Ambulatory Visit (HOSPITAL_COMMUNITY): Payer: Self-pay

## 2024-12-09 ENCOUNTER — Ambulatory Visit (HOSPITAL_COMMUNITY)

## 2024-12-19 ENCOUNTER — Telehealth: Admitting: Nurse Practitioner

## 2024-12-19 DIAGNOSIS — Z20828 Contact with and (suspected) exposure to other viral communicable diseases: Secondary | ICD-10-CM

## 2024-12-19 DIAGNOSIS — R6889 Other general symptoms and signs: Secondary | ICD-10-CM | POA: Diagnosis not present

## 2024-12-19 MED ORDER — BENZONATATE 100 MG PO CAPS: 100.0000 mg | ORAL_CAPSULE | Freq: Three times a day (TID) | ORAL | 0 refills | Status: AC | PRN

## 2024-12-19 MED ORDER — OSELTAMIVIR PHOSPHATE 75 MG PO CAPS: 75.0000 mg | ORAL_CAPSULE | Freq: Two times a day (BID) | ORAL | 0 refills | Status: AC

## 2024-12-19 NOTE — Progress Notes (Signed)
 E visit for Flu like symptoms   We are sorry that you are not feeling well.  Here is how we plan to help! Based on what you have shared with me it looks like you may have a respiratory virus that may be influenza.  Influenza or the flu is  an infection caused by a respiratory virus. The flu virus is highly contagious and persons who did not receive their yearly flu vaccination may catch the flu from close contact.  We have anti-viral medications to treat the viruses that cause this infection. They are not a cure and only shorten the course of the infection. These prescriptions are most effective when they are given within the first 2 days of flu symptoms. Antiviral medications are indicated if you have a high risk of complications from the flu. You should  also consider an antiviral medication if you are in close contact with someone who is at risk. These medications can help patients avoid complications from the flu but have side effects that you should know.   Possible side effects from Tamiflu or oseltamivir include nausea, vomiting, diarrhea, dizziness, headaches, eye redness, sleep problems or other respiratory symptoms. You should not take Tamiflu if you have an allergy to oseltamivir or any to the ingredients in Tamiflu.  Based upon your symptoms and potential risk factors I have prescribed Oseltamivir (Tamiflu).  It has been sent to your designated pharmacy.  You will take one 75 mg capsule orally twice a day for the next 5 days.   For nasal congestion, you may use an oral decongestant such as Mucinex D or if you have glaucoma or high blood pressure use plain Mucinex.  Saline nasal spray or nasal drops can help and can safely be used as often as needed for congestion.  If you have a sore or scratchy throat, use a saltwater gargle-  to  teaspoon of salt dissolved in a 4-ounce to 8-ounce glass of warm water.  Gargle the solution for approximately 15-30 seconds and then spit.  It is  important not to swallow the solution.  You can also use throat lozenges/cough drops and Chloraseptic spray to help with throat pain or discomfort.  Warm or cold liquids can also be helpful in relieving throat pain.  For headache, pain or general discomfort, you can use Ibuprofen or Tylenol as directed.   Some authorities believe that zinc sprays or the use of Echinacea may shorten the course of your symptoms.  I have prescribed the following medications to help lessen symptoms: I have prescribed Tessalon  Perles 100 mg. You may take 1-2 capsules every 8 hours as needed for cough  You are to isolate at home until you have been fever-free for at least 24 hours without a fever-reducing medication, and symptoms have been steadily improving for 24 hours.  If you must be around other household members who do not have symptoms, you need to make sure that both you and the family members are masking consistently with a high-quality mask.  If you note any worsening of symptoms despite treatment, please seek an in-person evaluation ASAP. If you note any significant shortness of breath or any chest pain, please seek ED evaluation. Please do not delay care!  ANYONE WHO HAS FLU SYMPTOMS SHOULD: Stay home. The flu is highly contagious and going out or to work exposes others! Be sure to drink plenty of fluids. Water is fine as well as fruit juices, sodas and electrolyte beverages. You may want to stay  away from caffeine or alcohol. If you are nauseated, try taking small sips of liquids. How do you know if you are getting enough fluid? Your urine should be a pale yellow or almost colorless. Get rest. Taking a steamy shower or using a humidifier may help nasal congestion and ease sore throat pain. Using a saline nasal spray works much the same way. Cough drops, hard candies and sore throat lozenges may ease your cough. Line up a caregiver. Have someone check on you regularly.  GET HELP RIGHT AWAY IF: You cannot  keep down liquids or your medications. You become short of breath Your fell like you are going to pass out or loose consciousness. Your symptoms persist after you have completed your treatment plan  MAKE SURE YOU  Understand these instructions. Will watch your condition. Will get help right away if you are not doing well or get worse.  Your e-visit answers were reviewed by a board certified advanced clinical practitioner to complete your personal care plan.  Depending on the condition, your plan could have included both over the counter or prescription medications.  If there is a problem please reply  once you have received a response from your provider.  Your safety is important to us .  If you have drug allergies check your prescription carefully.    You can use MyChart to ask questions about todays visit, request a non-urgent call back, or ask for a work or school excuse for 24 hours related to this e-Visit. If it has been greater than 24 hours you will need to follow up with your provider, or enter a new e-Visit to address those concerns.  You will get an e-mail in the next two days asking about your experience.  I hope that your e-visit has been valuable and will speed your recovery. Thank you for using e-visits.   I have spent 5 minutes in review of e-visit questionnaire, review and updating patient chart, medical decision making and response to patient.   Lauraine Kitty, FNP

## 2024-12-31 ENCOUNTER — Telehealth: Admitting: Physician Assistant

## 2024-12-31 DIAGNOSIS — N898 Other specified noninflammatory disorders of vagina: Secondary | ICD-10-CM

## 2024-12-31 NOTE — Progress Notes (Signed)
" °  Because of your symptoms and the need for a physical exam, I feel your condition warrants further evaluation and I recommend that you be seen in a face-to-face visit.   NOTE: There will be NO CHARGE for this E-Visit   If you are having a true medical emergency, please call 911.     For an urgent face to face visit, [facility] has multiple urgent care centers for your convenience.  Click the link below for the full list of locations and hours, walk-in wait times, appointment scheduling options and driving directions:  Urgent Care - [place], [place], [place], [place], [place], [place]       Your MyChart E-visit questionnaire answers were reviewed by a board certified advanced clinical practitioner to complete your personal care plan based on your specific symptoms.    Thank you for using e-Visits.    "

## 2025-01-10 ENCOUNTER — Emergency Department (HOSPITAL_COMMUNITY)
Admission: EM | Admit: 2025-01-10 | Discharge: 2025-01-10 | Disposition: A | Discharge status: Home / Self Care | Attending: Emergency Medicine | Admitting: Emergency Medicine

## 2025-01-10 ENCOUNTER — Other Ambulatory Visit: Payer: Self-pay

## 2025-01-10 ENCOUNTER — Emergency Department (HOSPITAL_COMMUNITY)

## 2025-01-10 ENCOUNTER — Encounter (HOSPITAL_COMMUNITY): Payer: Self-pay | Admitting: Pharmacy Technician

## 2025-01-10 DIAGNOSIS — R0789 Other chest pain: Secondary | ICD-10-CM | POA: Diagnosis present

## 2025-01-10 DIAGNOSIS — Y92410 Unspecified street and highway as the place of occurrence of the external cause: Secondary | ICD-10-CM | POA: Insufficient documentation

## 2025-01-10 MED ORDER — METHOCARBAMOL 500 MG PO TABS: 500.0000 mg | ORAL_TABLET | Freq: Two times a day (BID) | ORAL | 0 refills | Status: AC

## 2025-01-10 MED ORDER — ACETAMINOPHEN 325 MG PO TABS
650.0000 mg | ORAL_TABLET | Freq: Once | ORAL | Status: AC
  Administered 2025-01-10: 650 mg via ORAL
  Filled 2025-01-10: qty 2

## 2025-01-10 NOTE — ED Notes (Signed)
 Patient Alert and oriented to baseline. Stable and ambulatory to baseline. Patient verbalized understanding of the discharge instructions.  Patient belongings were taken by the patient.
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
## 2025-01-10 NOTE — Discharge Instructions (Signed)
